# Patient Record
Sex: Male | Born: 1935 | Hispanic: No | Marital: Married
Health system: Southern US, Community
[De-identification: ages and names within clinical notes are randomized; demographics above are authoritative.]

---

## 1998-05-11 ENCOUNTER — Encounter: Payer: Self-pay | Admitting: Emergency Medicine

## 1998-05-12 ENCOUNTER — Inpatient Hospital Stay (HOSPITAL_COMMUNITY): Admission: EM | Admit: 1998-05-12 | Discharge: 1998-05-16 | Payer: Self-pay | Admitting: Family Medicine

## 1998-06-07 ENCOUNTER — Encounter: Payer: Self-pay | Admitting: Emergency Medicine

## 1998-06-07 ENCOUNTER — Emergency Department (HOSPITAL_COMMUNITY): Admission: EM | Admit: 1998-06-07 | Discharge: 1998-06-07 | Payer: Self-pay | Admitting: Emergency Medicine

## 1998-06-17 ENCOUNTER — Encounter: Payer: Self-pay | Admitting: Urology

## 1998-06-18 ENCOUNTER — Inpatient Hospital Stay (HOSPITAL_COMMUNITY): Admission: RE | Admit: 1998-06-18 | Discharge: 1998-06-22 | Payer: Self-pay | Admitting: Urology

## 1998-06-18 ENCOUNTER — Encounter: Payer: Self-pay | Admitting: Urology

## 1998-08-18 ENCOUNTER — Emergency Department (HOSPITAL_COMMUNITY): Admission: EM | Admit: 1998-08-18 | Discharge: 1998-08-18 | Payer: Self-pay | Admitting: Emergency Medicine

## 2001-04-23 ENCOUNTER — Emergency Department (HOSPITAL_COMMUNITY): Admission: EM | Admit: 2001-04-23 | Discharge: 2001-04-23 | Payer: Self-pay | Admitting: Emergency Medicine

## 2001-05-30 ENCOUNTER — Encounter: Payer: Self-pay | Admitting: Emergency Medicine

## 2001-05-30 ENCOUNTER — Inpatient Hospital Stay (HOSPITAL_COMMUNITY): Admission: EM | Admit: 2001-05-30 | Discharge: 2001-06-03 | Payer: Self-pay | Admitting: Emergency Medicine

## 2001-06-02 ENCOUNTER — Encounter: Payer: Self-pay | Admitting: Cardiology

## 2001-06-14 ENCOUNTER — Ambulatory Visit: Admission: RE | Admit: 2001-06-14 | Discharge: 2001-06-14 | Payer: Self-pay | Admitting: Endocrinology

## 2001-06-14 ENCOUNTER — Encounter: Payer: Self-pay | Admitting: Endocrinology

## 2001-08-06 ENCOUNTER — Emergency Department (HOSPITAL_COMMUNITY): Admission: EM | Admit: 2001-08-06 | Discharge: 2001-08-06 | Payer: Self-pay | Admitting: Emergency Medicine

## 2001-08-06 ENCOUNTER — Encounter: Payer: Self-pay | Admitting: Cardiology

## 2001-08-06 ENCOUNTER — Encounter: Payer: Self-pay | Admitting: Emergency Medicine

## 2001-08-08 ENCOUNTER — Encounter: Payer: Self-pay | Admitting: Emergency Medicine

## 2001-08-08 ENCOUNTER — Observation Stay (HOSPITAL_COMMUNITY): Admission: EM | Admit: 2001-08-08 | Discharge: 2001-08-09 | Payer: Self-pay | Admitting: Emergency Medicine

## 2001-09-06 ENCOUNTER — Emergency Department (HOSPITAL_COMMUNITY): Admission: EM | Admit: 2001-09-06 | Discharge: 2001-09-06 | Payer: Self-pay | Admitting: Emergency Medicine

## 2004-06-12 ENCOUNTER — Ambulatory Visit: Payer: Self-pay | Admitting: Cardiology

## 2004-06-26 ENCOUNTER — Ambulatory Visit: Payer: Self-pay | Admitting: Cardiology

## 2004-07-24 ENCOUNTER — Ambulatory Visit: Payer: Self-pay | Admitting: Cardiology

## 2004-08-11 ENCOUNTER — Ambulatory Visit: Payer: Self-pay | Admitting: Cardiology

## 2005-07-06 ENCOUNTER — Ambulatory Visit: Payer: Self-pay | Admitting: Cardiology

## 2005-07-20 ENCOUNTER — Ambulatory Visit: Payer: Self-pay | Admitting: Cardiology

## 2005-08-17 ENCOUNTER — Ambulatory Visit: Payer: Self-pay | Admitting: Cardiology

## 2005-09-04 ENCOUNTER — Ambulatory Visit: Payer: Self-pay | Admitting: Cardiology

## 2005-09-25 ENCOUNTER — Ambulatory Visit: Payer: Self-pay | Admitting: Cardiology

## 2006-08-28 ENCOUNTER — Ambulatory Visit: Payer: Self-pay | Admitting: Cardiology

## 2006-08-28 ENCOUNTER — Inpatient Hospital Stay (HOSPITAL_COMMUNITY): Admission: EM | Admit: 2006-08-28 | Discharge: 2006-09-01 | Payer: Self-pay | Admitting: Emergency Medicine

## 2006-08-30 ENCOUNTER — Encounter: Payer: Self-pay | Admitting: Internal Medicine

## 2006-08-30 ENCOUNTER — Ambulatory Visit: Payer: Self-pay | Admitting: Internal Medicine

## 2006-09-03 ENCOUNTER — Ambulatory Visit: Payer: Self-pay | Admitting: Cardiology

## 2006-09-06 ENCOUNTER — Ambulatory Visit: Payer: Self-pay | Admitting: Cardiology

## 2006-09-14 ENCOUNTER — Ambulatory Visit: Payer: Self-pay | Admitting: Cardiology

## 2006-09-21 ENCOUNTER — Ambulatory Visit: Payer: Self-pay | Admitting: Cardiovascular Disease

## 2006-09-21 ENCOUNTER — Ambulatory Visit: Payer: Self-pay | Admitting: Cardiology

## 2006-09-21 ENCOUNTER — Emergency Department (HOSPITAL_COMMUNITY): Admission: EM | Admit: 2006-09-21 | Discharge: 2006-09-21 | Payer: Self-pay | Admitting: Emergency Medicine

## 2006-09-21 LAB — CONVERTED CEMR LAB
Creatinine, Ser: 0.9 mg/dL (ref 0.4–1.5)
Glucose, Bld: 118 mg/dL — ABNORMAL HIGH (ref 70–99)
Potassium: 3.6 meq/L (ref 3.5–5.1)
Pro B Natriuretic peptide (BNP): 257 pg/mL — ABNORMAL HIGH (ref 0.0–100.0)
Sodium: 141 meq/L (ref 135–145)

## 2006-09-27 ENCOUNTER — Ambulatory Visit: Payer: Self-pay | Admitting: Cardiology

## 2006-09-27 LAB — CONVERTED CEMR LAB
CO2: 29 meq/L (ref 19–32)
Chloride: 103 meq/L (ref 96–112)
Creatinine, Ser: 1 mg/dL (ref 0.4–1.5)
Glucose, Bld: 104 mg/dL — ABNORMAL HIGH (ref 70–99)
Potassium: 3.8 meq/L (ref 3.5–5.1)
Pro B Natriuretic peptide (BNP): 96 pg/mL (ref 0.0–100.0)
Sodium: 139 meq/L (ref 135–145)

## 2006-10-07 ENCOUNTER — Ambulatory Visit: Payer: Self-pay | Admitting: Internal Medicine

## 2006-10-07 ENCOUNTER — Ambulatory Visit: Payer: Self-pay | Admitting: Cardiology

## 2006-10-28 ENCOUNTER — Ambulatory Visit: Payer: Self-pay | Admitting: Internal Medicine

## 2006-10-28 ENCOUNTER — Ambulatory Visit: Payer: Self-pay | Admitting: Cardiology

## 2006-10-29 ENCOUNTER — Ambulatory Visit: Payer: Self-pay | Admitting: Cardiology

## 2006-10-29 LAB — CONVERTED CEMR LAB
CO2: 28 meq/L (ref 19–32)
Chloride: 101 meq/L (ref 96–112)
Creatinine, Ser: 0.9 mg/dL (ref 0.4–1.5)
Glucose, Bld: 139 mg/dL — ABNORMAL HIGH (ref 70–99)
Potassium: 3.5 meq/L (ref 3.5–5.1)
Sodium: 138 meq/L (ref 135–145)

## 2006-11-16 ENCOUNTER — Emergency Department (HOSPITAL_COMMUNITY): Admission: EM | Admit: 2006-11-16 | Discharge: 2006-11-16 | Payer: Self-pay | Admitting: Family Medicine

## 2006-11-25 ENCOUNTER — Ambulatory Visit (HOSPITAL_COMMUNITY): Admission: RE | Admit: 2006-11-25 | Discharge: 2006-11-25 | Payer: Self-pay | Admitting: Nurse Practitioner

## 2006-11-25 ENCOUNTER — Ambulatory Visit: Payer: Self-pay | Admitting: Cardiology

## 2006-12-16 ENCOUNTER — Ambulatory Visit: Payer: Self-pay | Admitting: Cardiology

## 2007-01-18 ENCOUNTER — Ambulatory Visit: Payer: Self-pay | Admitting: Cardiovascular Disease

## 2007-01-18 ENCOUNTER — Ambulatory Visit: Payer: Self-pay | Admitting: Cardiology

## 2007-12-09 ENCOUNTER — Ambulatory Visit: Payer: Self-pay | Admitting: Cardiovascular Disease

## 2007-12-16 ENCOUNTER — Ambulatory Visit: Payer: Self-pay | Admitting: Cardiology

## 2007-12-21 ENCOUNTER — Ambulatory Visit: Payer: Self-pay | Admitting: Cardiology

## 2008-06-25 IMAGING — CR DG CHEST 2V
2 series · 2 of 2 positions shown · non-contrast
Comparison: None.

CLINICAL DATA: 78-year-old male with pain, shortness of breath, and right hand swelling. 
 CHEST - 2 VIEW:

[w chest pa]
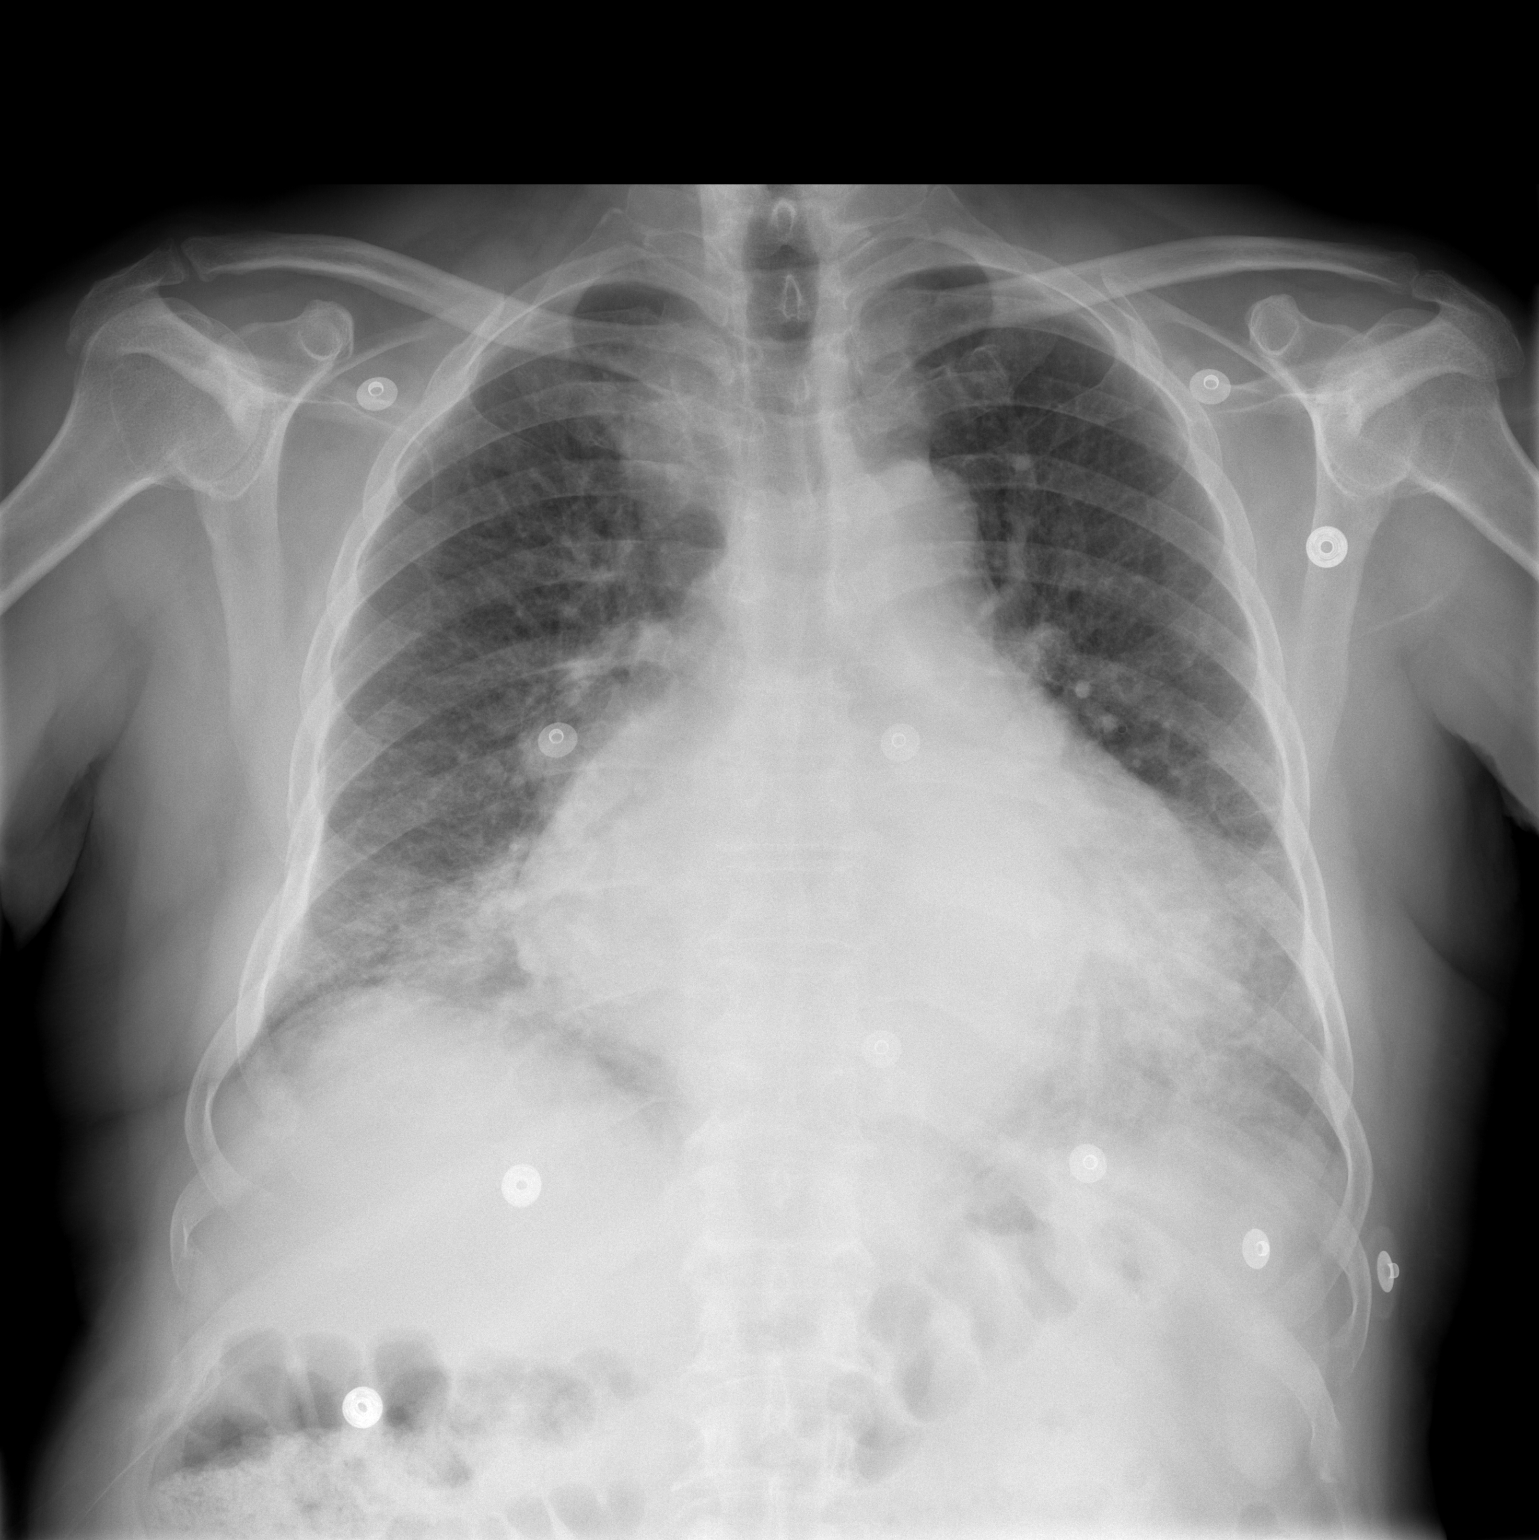

[w chest lat]
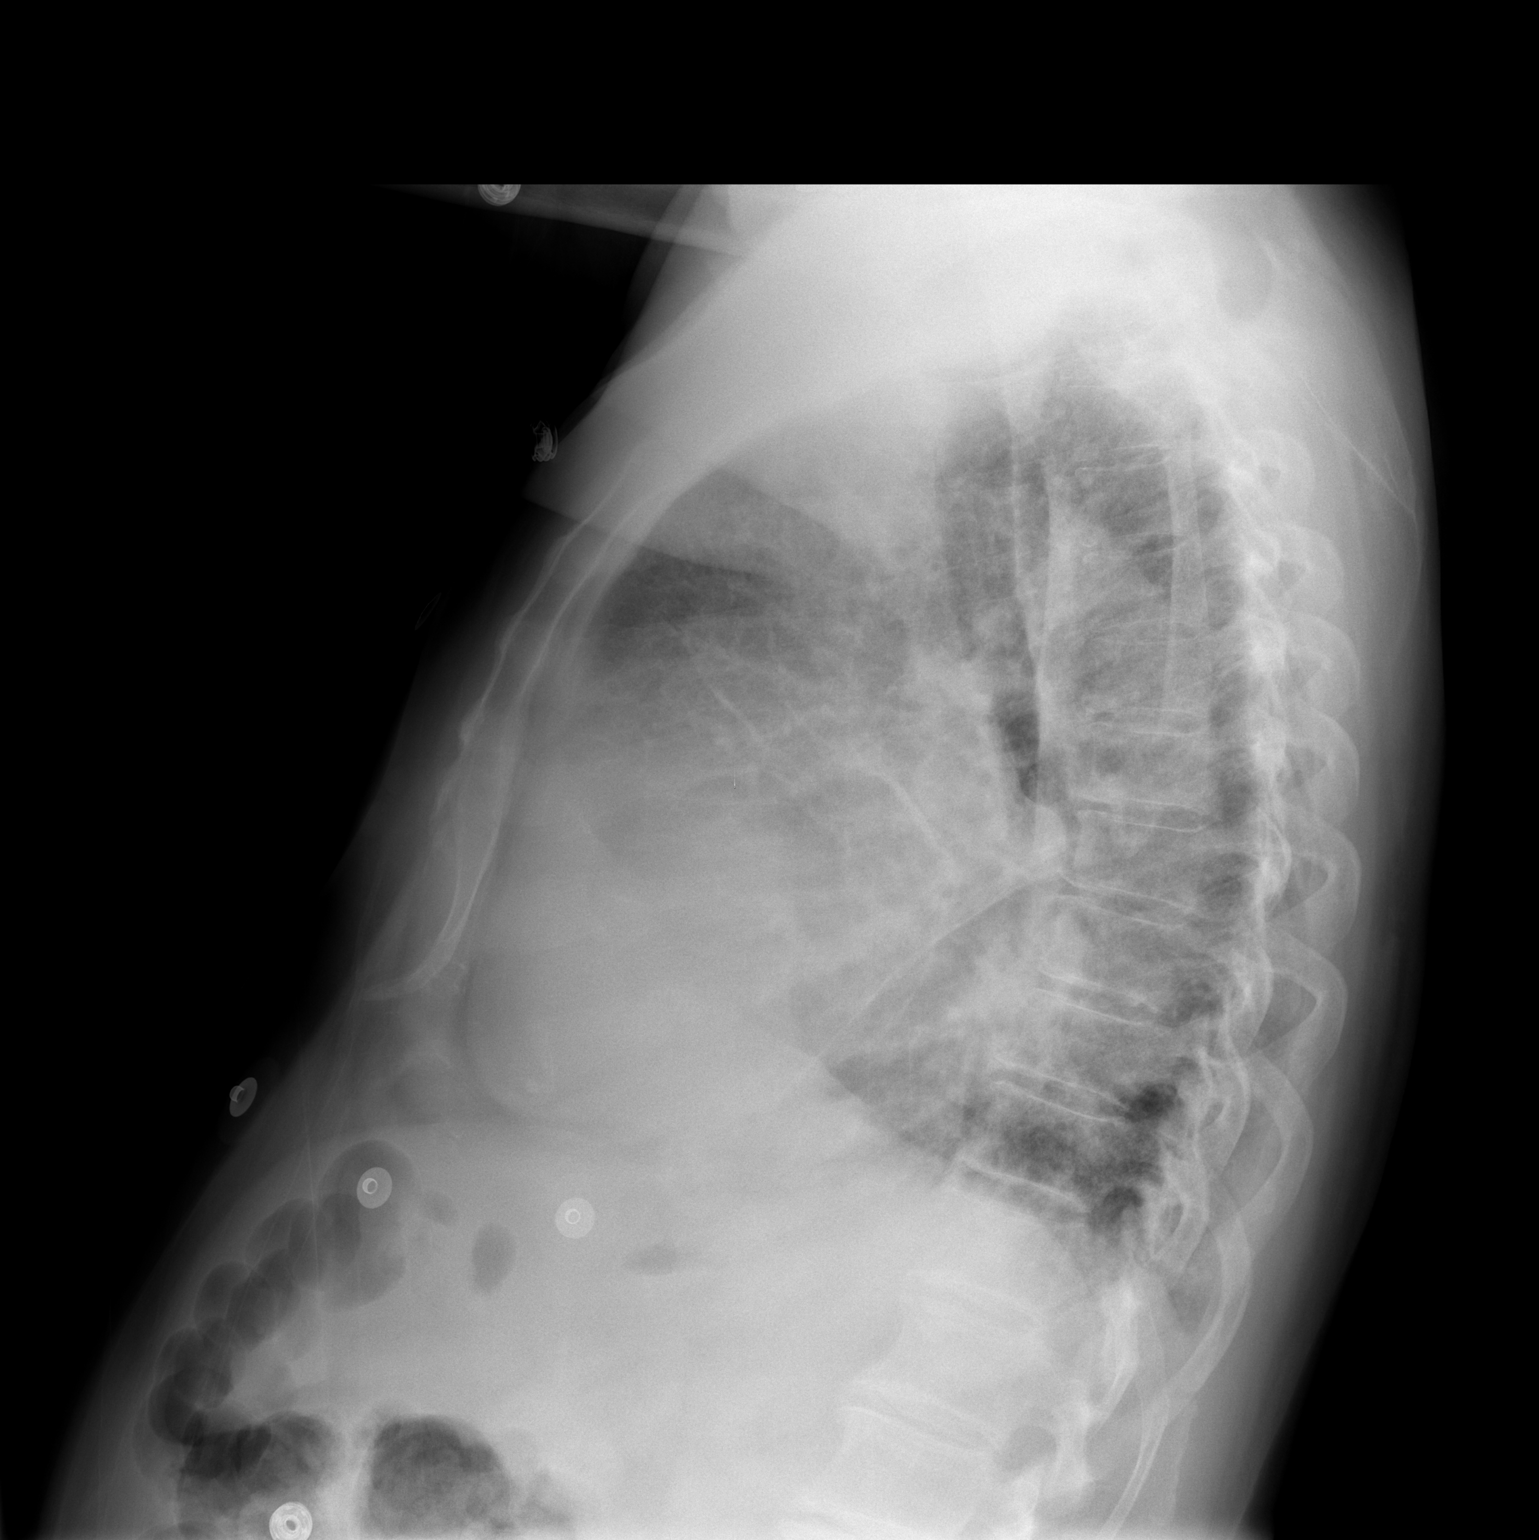

[2 of 2 positions shown; findings below may reference images not displayed]

FINDINGS: There is marked global cardiomegaly. There is increased pulmonary interstitial opacity and obscuration of vessel margins compatible with pulmonary edema. Increased bibasilar opacity likely reflects superimposed atelectasis. Small right pleural effusion is suspected on the lateral view. No pneumothorax. No acute osseous abnormality identified.
IMPRESSION: Marked cardiomegaly and acute pulmonary edema compatible with congestive heart failure. Superimposed basilar atelectasis.

## 2008-06-25 IMAGING — CR DG WRIST COMPLETE 3+V*R*
4 series · 4 of 4 positions shown · non-contrast
Comparison: none

CLINICAL DATA: Right wrist pain, swelling

RIGHT WRIST - 4 VIEW

[x wrist pa right]
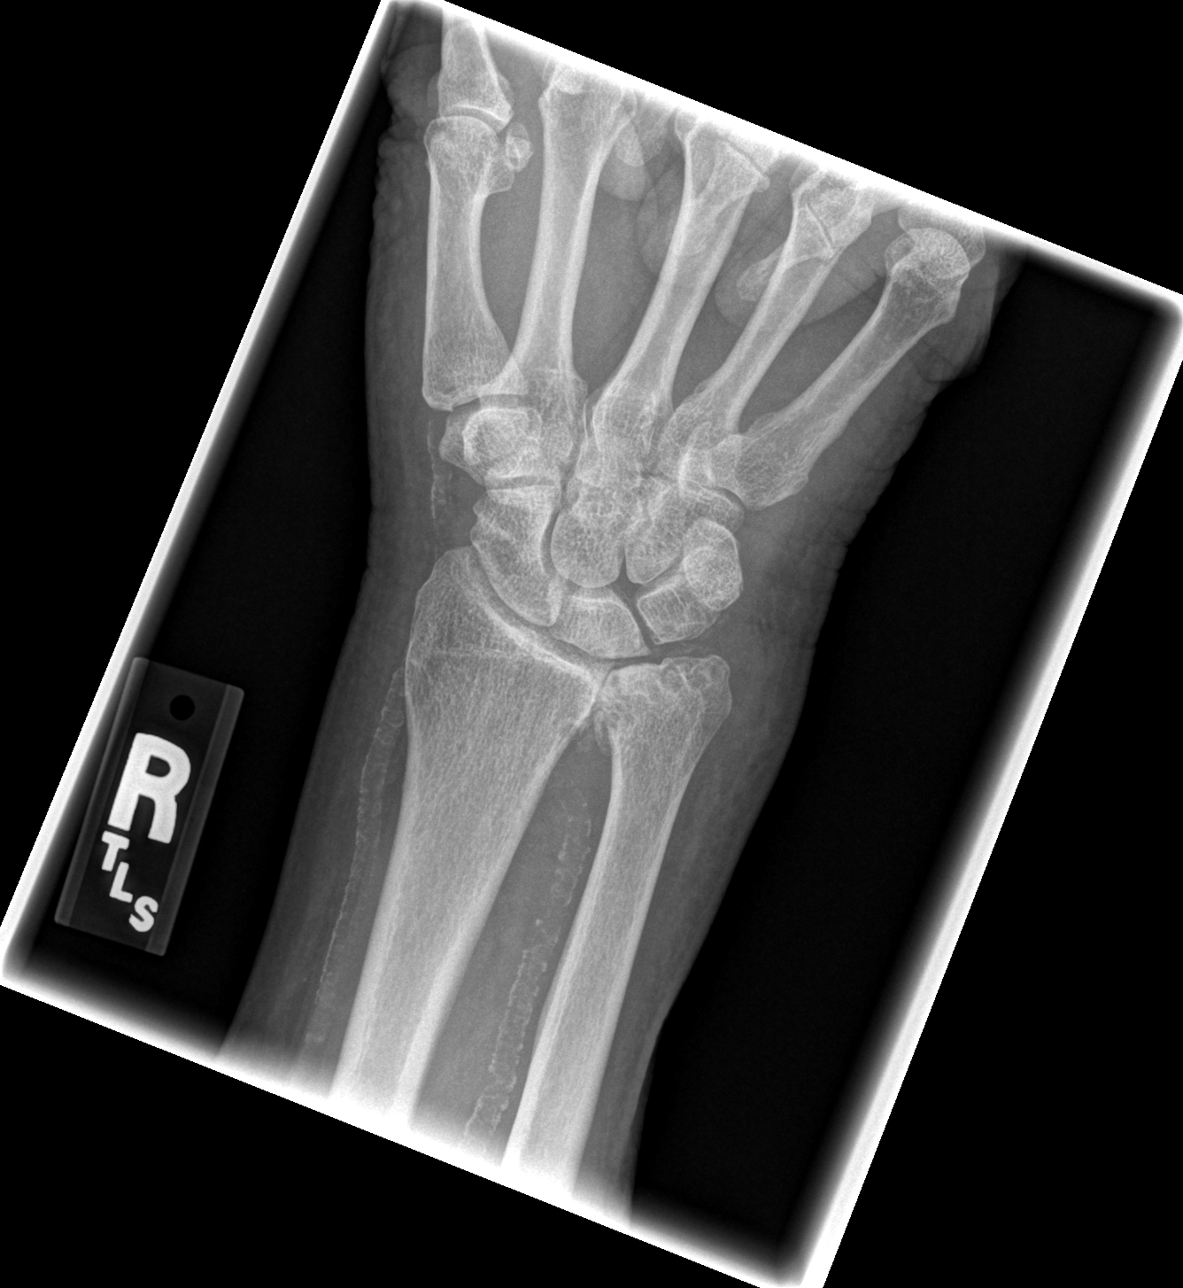

[x wrist obl right]
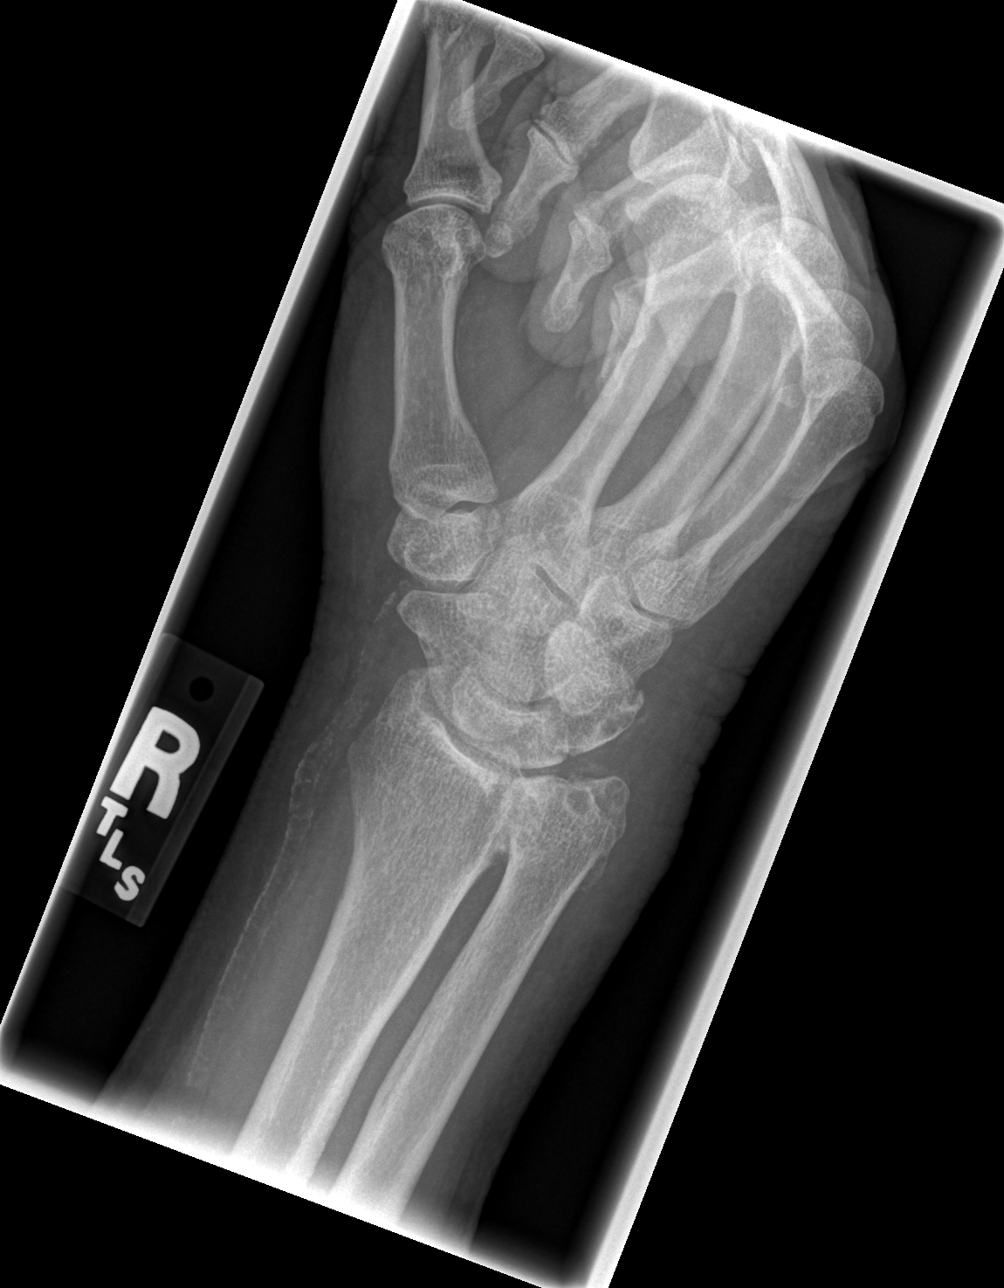

[x wrist lat right]
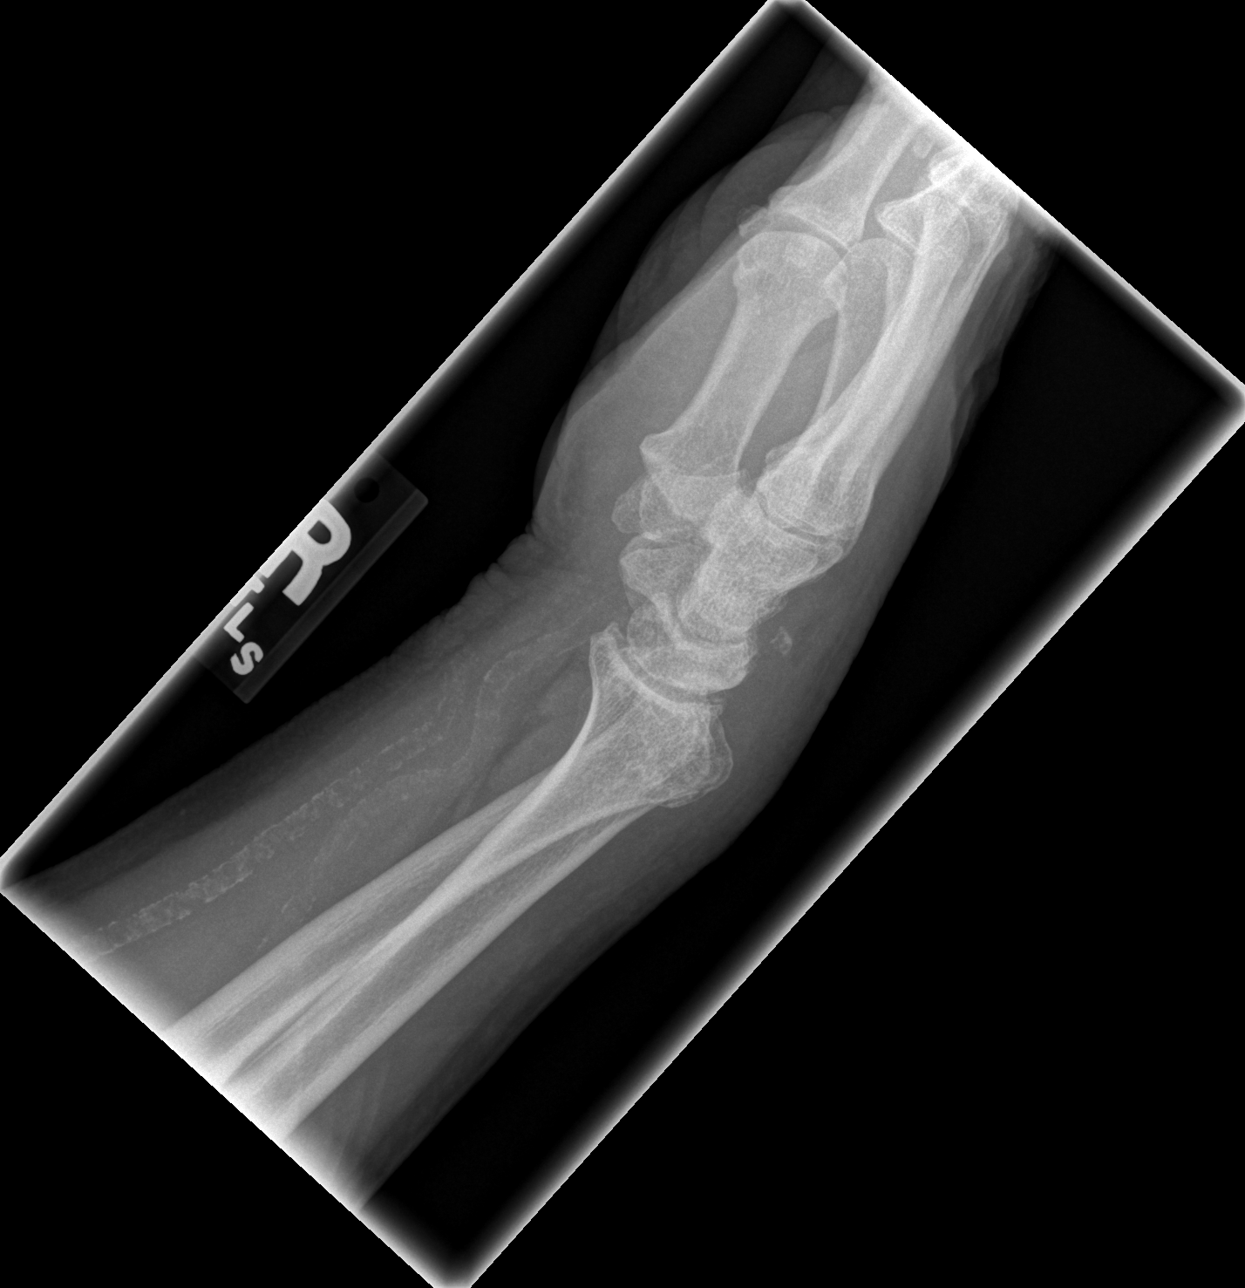

[x navicular]
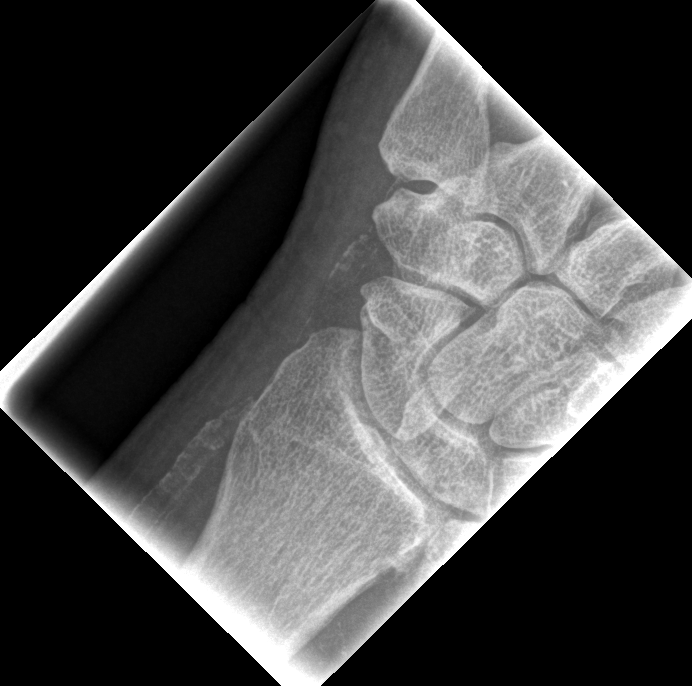

[4 of 4 positions shown; findings below may reference images not displayed]

FINDINGS: Advanced degenerative changes are noted in the wrist. Cystic changes
seen in the distal ulna and triquetrum. Joint space narrowing noted. There is
soft tissue swelling over the dorsum of the wrist. No acute bony abnormality.
Specifically, no fracture, subluxation, or dislocation.

IMPRESSION

Severe degenerative changes in the wrist. No acute bony abnormality.

## 2008-06-29 IMAGING — CR DG CHEST 2V
2 series · 2 of 2 positions shown · non-contrast
Comparison: 08/28/06.

CLINICAL DATA: Shortness of breath, CHF. 
 CHEST - 2 VIEW:

[w chest pa]
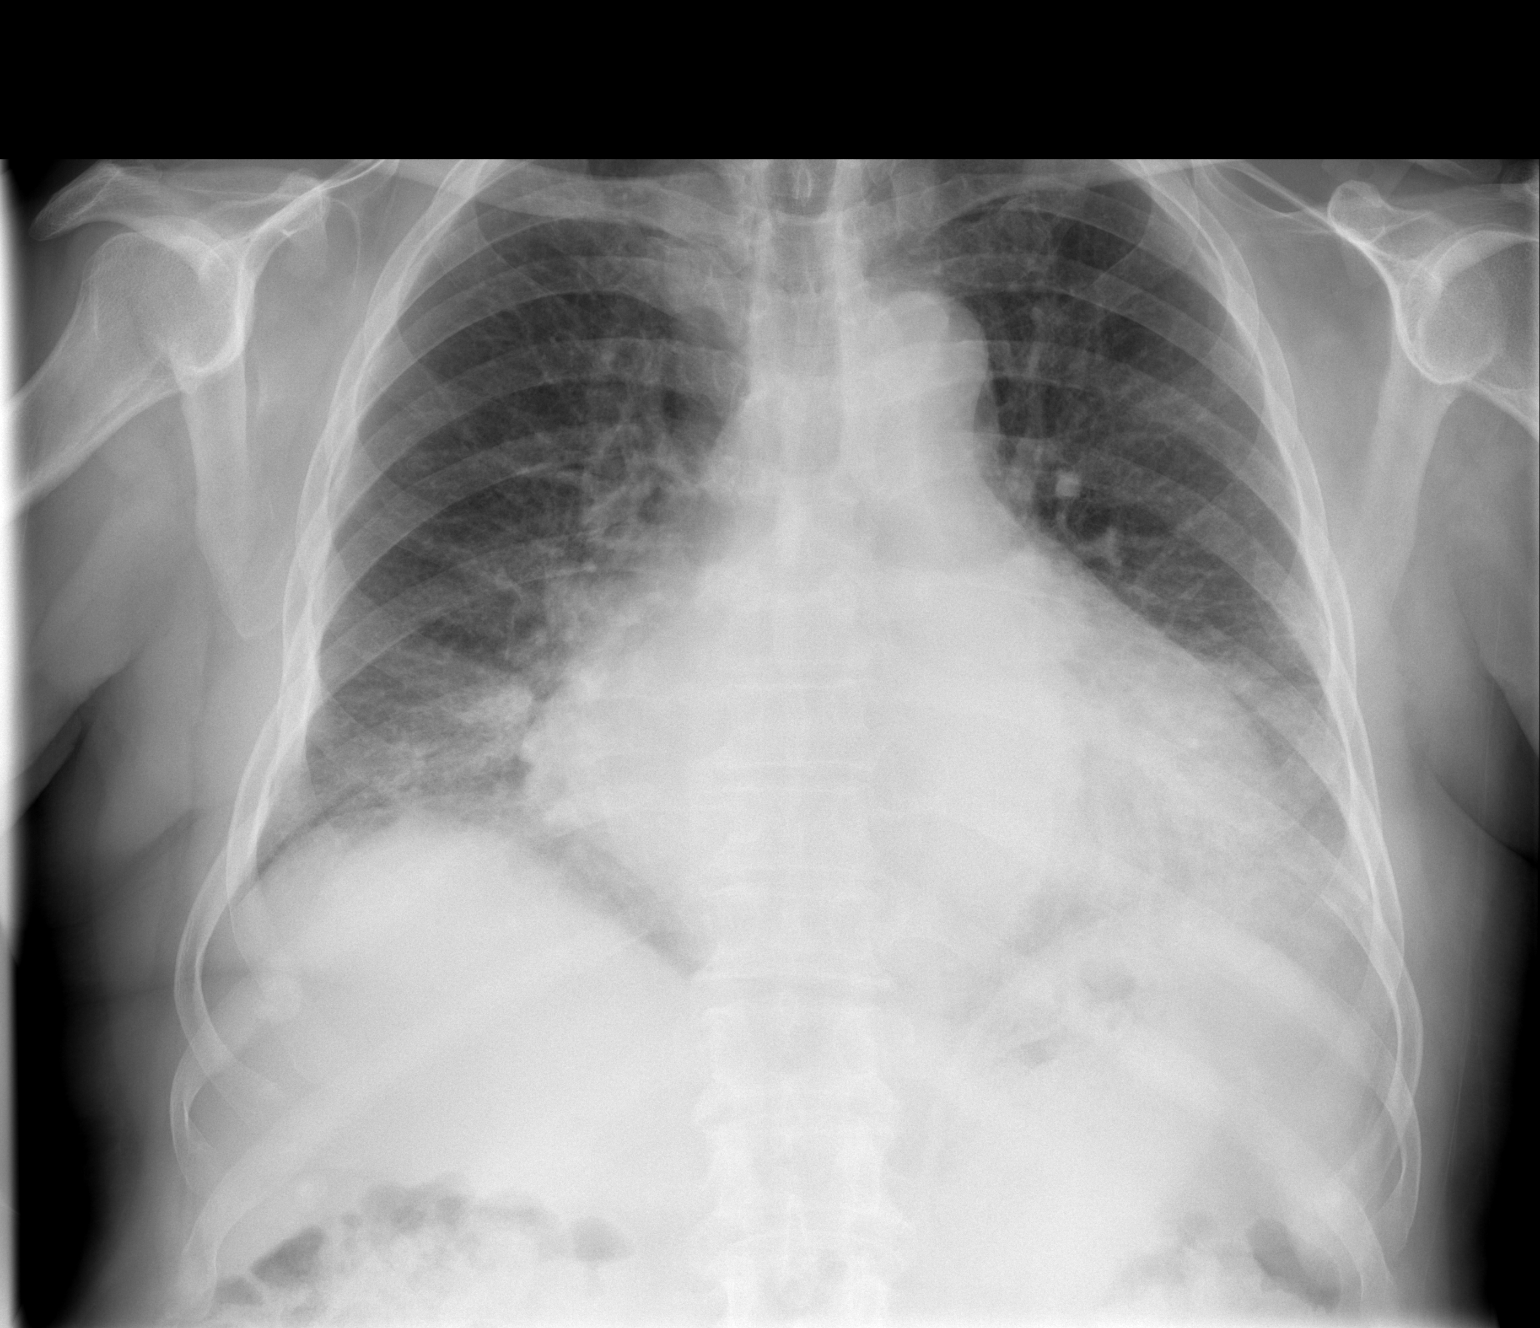

[w chest lat]
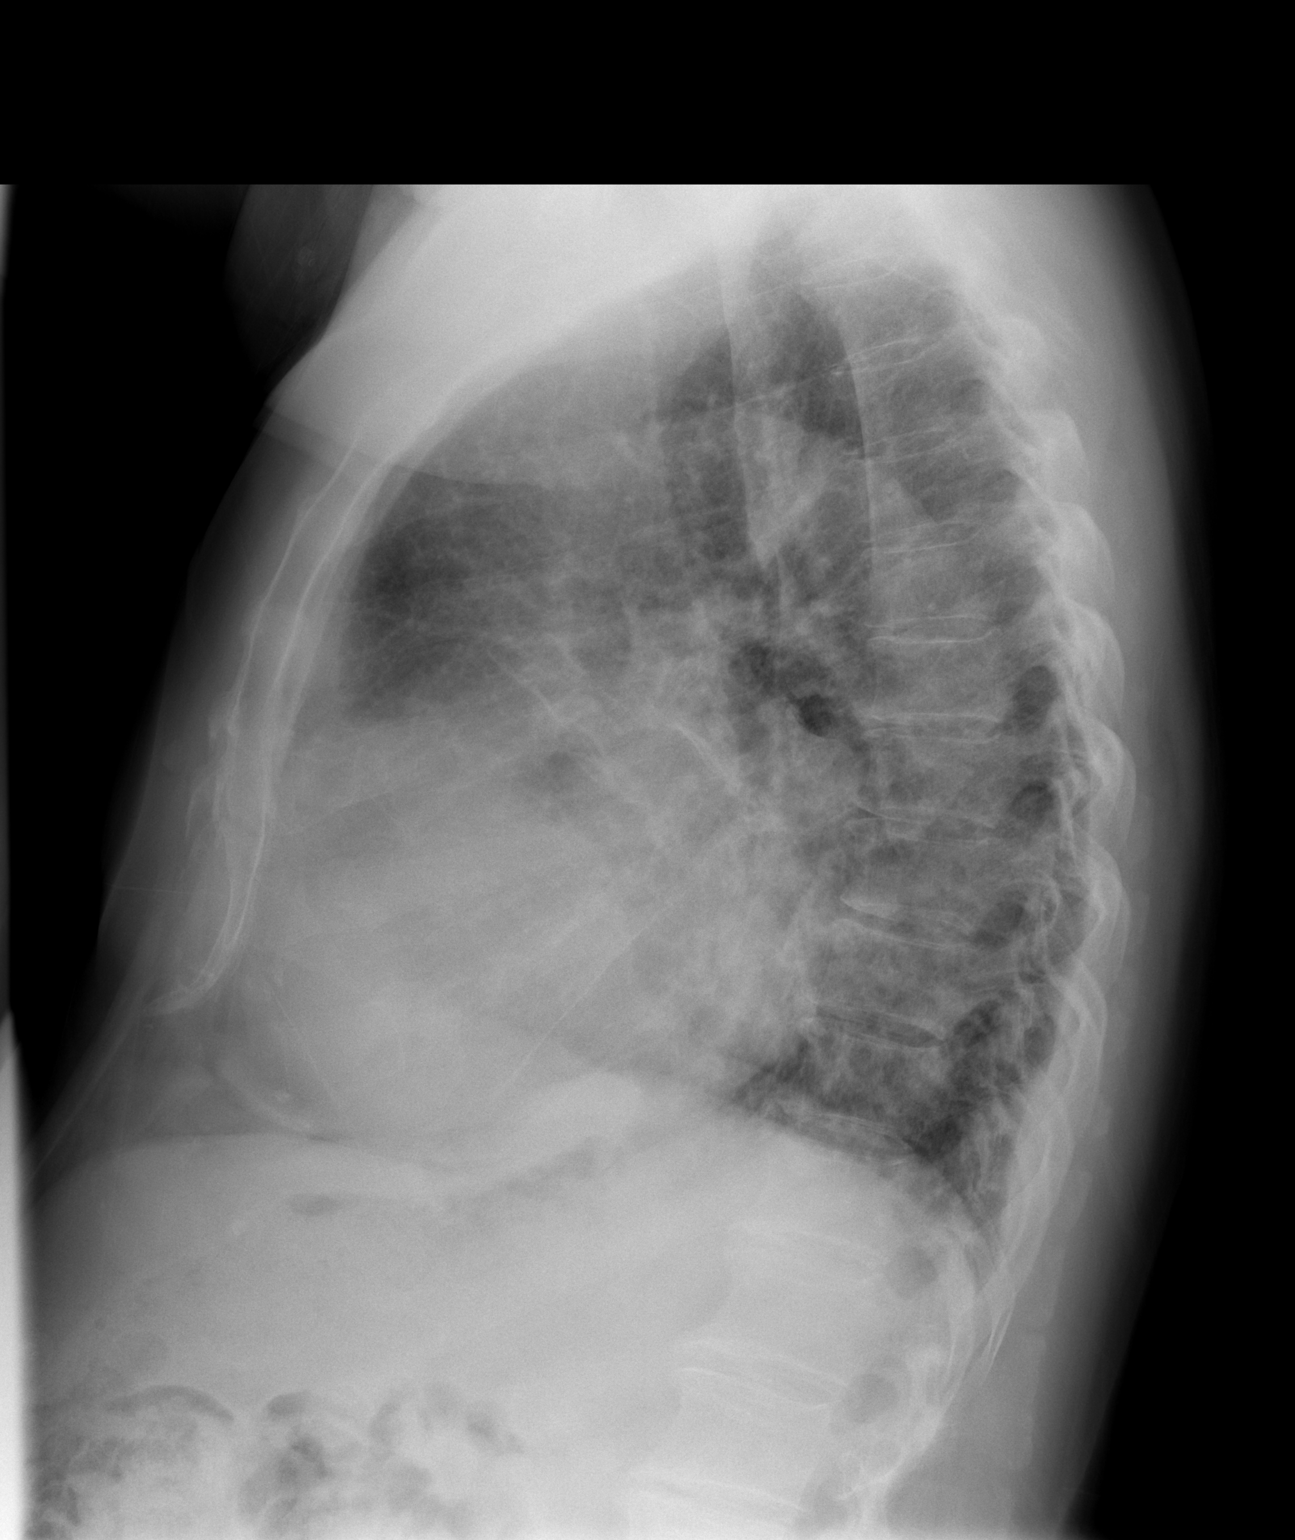

[2 of 2 positions shown; findings below may reference images not displayed]

FINDINGS: The heart is enlarged.  There is vascular congestion with mild edema which is unchanged.  Bibasilar atelectasis again noted without much change.
IMPRESSION: No significant change in congestive heart failure with mild edema.

## 2008-07-19 DIAGNOSIS — Z87898 Personal history of other specified conditions: Secondary | ICD-10-CM

## 2008-07-19 DIAGNOSIS — I4891 Unspecified atrial fibrillation: Secondary | ICD-10-CM

## 2008-07-19 DIAGNOSIS — I509 Heart failure, unspecified: Secondary | ICD-10-CM | POA: Insufficient documentation

## 2008-07-19 DIAGNOSIS — M109 Gout, unspecified: Secondary | ICD-10-CM

## 2008-07-19 DIAGNOSIS — M129 Arthropathy, unspecified: Secondary | ICD-10-CM | POA: Insufficient documentation

## 2008-07-19 DIAGNOSIS — K219 Gastro-esophageal reflux disease without esophagitis: Secondary | ICD-10-CM

## 2008-08-07 ENCOUNTER — Encounter: Payer: Self-pay | Admitting: *Deleted

## 2008-09-12 ENCOUNTER — Encounter: Payer: Self-pay | Admitting: *Deleted

## 2008-10-18 ENCOUNTER — Encounter: Payer: Self-pay | Admitting: Cardiology

## 2008-10-24 ENCOUNTER — Encounter (INDEPENDENT_AMBULATORY_CARE_PROVIDER_SITE_OTHER): Payer: Self-pay | Admitting: *Deleted

## 2010-02-18 ENCOUNTER — Telehealth: Payer: Self-pay | Admitting: Cardiology

## 2010-03-28 ENCOUNTER — Telehealth: Payer: Self-pay | Admitting: Cardiology

## 2010-03-31 ENCOUNTER — Encounter: Payer: Self-pay | Admitting: Cardiology

## 2010-04-04 ENCOUNTER — Ambulatory Visit: Admission: RE | Admit: 2010-04-04 | Discharge: 2010-04-04 | Payer: Self-pay | Source: Home / Self Care

## 2010-04-10 NOTE — Progress Notes (Signed)
Summary: refill request  Medications Added WARFARIN SODIUM 2.5 MG TABS (WARFARIN SODIUM) Use as directed by Anticoagualtion Clinic       Phone Note Refill Request Message from:  Patient on March 28, 2010 1:35 PM  coumadin refill/will run out "sunday   Method Requested: Telephone to Pharmacy Initial call taken by: Melissa Tatum,  March 28, 2010 1:35 PM  Follow-up for Phone Call        Spoke with daughter-in-law and she states that pt has been in [place] for over a year but he has been taking his coumadin there and will run out on Sunday. They are requesting a refill. Coumadin is not listed on his MAR. He doesn't see Dr Anala Whisenant til Feb 2012. Please advise.  Follow-up by: Tiffany Muse, RN, BSN,  March 28, 2010 3:50 PM  Additional Follow-up for Phone Call Additional follow up Details #1::        Dr Pailyn Bellevue's nurse states we can authorize Rx from MD note 2009.  Additional Follow-up by: Tiffany Muse, RN, BSN,  March 28, 2010 3:56 PM    New/Updated Medications: WARFARIN SODIUM 2.5 MG TABS (WARFARIN SODIUM) Use as directed by Anticoagualtion Clinic Prescriptions: WARFARIN SODIUM 2.5 MG TABS (WARFARIN SODIUM) Use as directed by Anticoagualtion Clinic  #40 x 0   Entered by:   Tiffany Muse, RN, BSN   Authorized by:   Iver Miklas, MD, FACC   Signed by:   Tiffany Muse, RN, BSN on 03/28/2010   Method used:   Electronically to        [facility] [address] #[id number]* (retail)       [address]       [place], [place]  [address]       Ph: [phone]       Fax: [phone]   RxID:   [id number]

## 2010-04-10 NOTE — Medication Information (Signed)
Summary: rov/tm  Anticoagulant Therapy  Managed by: Bethena Midget, RN, BSN Referring MD: Illene Regulus Supervising MD: Tenny Craw MD, Gunnar Fusi Indication 1: Mitral Stenosis (ICD-394.0) Indication 2: Congestive Heart Failure (ICD-428.0) [place] Site: [facility] INR POC 1.5 INR RANGE 2 - 3  Dietary changes: no    Health status changes: no    Bleeding/hemorrhagic complications: no    Recent/future hospitalizations: no    Any changes in medication regimen? no    Recent/future dental: no  Any missed doses?: no       Is patient compliant with meds? yes      Comments: Pt returns from [place]. States he hasn't been off coumadin, no miss doses while he was there.   Anticoagulation Management History:      The patient is taking warfarin and comes in today for a routine follow up visit.  Positive risk factors for bleeding include an age of 75 years or older.  The bleeding index is 'intermediate risk'.  Positive CHADS2 values include History of CHF and Age > 75 years old.  The start date was 05/08/1998.  Anticoagulation responsible provider: Tenny Craw MD, Gunnar Fusi.  INR POC: 1.5.  Cuvette Lot#: [id number].  Exp: 03/2011.    Anticoagulation Management Assessment/Plan:      The patient's current anticoagulation dose is Warfarin sodium 2.5 mg tabs: Use as directed by Anticoagualtion Clinic.  The target INR is 2 - 3.  The next INR is due 04/11/2010.  Anticoagulation instructions were given to patient/Daugter in law.  Results were reviewed/authorized by Bethena Midget, RN, BSN.  He was notified by Bethena Midget, RN, BSN.         Prior Anticoagulation Instructions: 2.5mg  QD / 1.25MG  WED  Current Anticoagulation Instructions: INR 1.5 Today take 1.5 pills then resume 1 pill everyday. Recheck in one week.

## 2010-04-10 NOTE — Progress Notes (Signed)
Summary: questions about medications   Phone Note Call from Patient Call back at Home Phone [phone]   Caller: Daughter/Vicki Summary of Call: Pt daughter calling about medications Initial call taken by: Judie Grieve,  February 18, 2010 1:44 PM  Follow-up for Phone Call        refills sent in on 02/10/2010 the pharmacy is saying they didn't recieve.  Will send in again.  Daughter instructed that pt needs an appointment every year to continue to have meds refills.  Pt is in [place] now and she is unsure when he will return Follow-up by: Charolotte Capuchin, RN,  February 18, 2010 2:15 PM    Prescriptions: OMEPRAZOLE 20 MG CPDR (OMEPRAZOLE) 1 by mouth daily  #30 x 3   Entered by:   Charolotte Capuchin, RN   Authorized by:   Rollene Rotunda, MD, [facility]   Signed by:   Charolotte Capuchin, RN on 02/18/2010   Method used:   Electronically to        [facility] [address] #[id number]* (retail)       [address] [place], [place]  [address]       Ph: [phone]       Fax: [phone]   RxID:   [id number] DIGOXIN 0.125 MG TABS (DIGOXIN) 1 by mouth daily  #30 x 3   Entered by:   Charolotte Capuchin, RN   Authorized by:   Rollene Rotunda, MD, [facility]   Signed by:   Charolotte Capuchin, RN on 02/18/2010   Method used:   Electronically to        [facility] [address] #[id number]* (retail)       [address] [place], [place]  [address]       Ph: [phone]       Fax: [phone]   RxID:   [id number]

## 2010-04-11 ENCOUNTER — Encounter: Payer: Self-pay | Admitting: Cardiology

## 2010-04-11 ENCOUNTER — Encounter (INDEPENDENT_AMBULATORY_CARE_PROVIDER_SITE_OTHER): Payer: Medicare Other

## 2010-04-11 DIAGNOSIS — Z7901 Long term (current) use of anticoagulants: Secondary | ICD-10-CM

## 2010-04-11 DIAGNOSIS — I509 Heart failure, unspecified: Secondary | ICD-10-CM

## 2010-04-11 LAB — CONVERTED CEMR LAB: POC INR: 2.3

## 2010-04-16 NOTE — Letter (Signed)
Summary: [employer] Rx [employer]  [employer] Plan   Imported By: Marylou Mccoy 04/11/2010 12:27:25  _____________________________________________________________________  External Attachment:    Type:   Image     Comment:   External Document

## 2010-04-16 NOTE — Medication Information (Signed)
Summary: rov/tm   Anticoagulant Therapy  Managed by: Windell Hummingbird, RN Referring MD: Illene Regulus Supervising MD: Jens Som MD, Arlys John Indication 1: Mitral Stenosis (ICD-394.0) Indication 2: Congestive Heart Failure (ICD-428.0) [place] Site: [facility] INR POC 2.3 INR RANGE 2 - 3  Dietary changes: no    Health status changes: no    Bleeding/hemorrhagic complications: no    Recent/future hospitalizations: no    Any changes in medication regimen? no    Recent/future dental: no  Any missed doses?: no       Is patient compliant with meds? yes       Anticoagulation Management History:      The patient is taking warfarin and comes in today for a routine follow up visit.  Positive risk factors for bleeding include an age of 75 years or older.  The bleeding index is 'intermediate risk'.  Positive CHADS2 values include History of CHF and Age > 75 years old.  The start date was 05/08/1998.  Anticoagulation responsible provider: Jens Som MD, Arlys John.  INR POC: 2.3.  Cuvette Lot#: [id number].  Exp: 03/2011.    Anticoagulation Management Assessment/Plan:      The patient's current anticoagulation dose is Warfarin sodium 2.5 mg tabs: Use as directed by Anticoagualtion Clinic.  The target INR is 2 - 3.  The next INR is due 04/25/2010.  Anticoagulation instructions were given to patient/Daugter in law.  Results were reviewed/authorized by Windell Hummingbird, RN.  He was notified by Windell Hummingbird, RN.         Prior Anticoagulation Instructions: INR 1.5 Today take 1.5 pills then resume 1 pill everyday. Recheck in one week.   Current Anticoagulation Instructions: INR 2.3 Continue taking 1 tablet every day. Recheck in 2 weeks.

## 2010-04-25 ENCOUNTER — Encounter (INDEPENDENT_AMBULATORY_CARE_PROVIDER_SITE_OTHER): Payer: Medicare Other

## 2010-04-25 ENCOUNTER — Encounter: Payer: Self-pay | Admitting: Cardiology

## 2010-04-25 DIAGNOSIS — I509 Heart failure, unspecified: Secondary | ICD-10-CM

## 2010-04-25 DIAGNOSIS — Z7901 Long term (current) use of anticoagulants: Secondary | ICD-10-CM

## 2010-04-25 LAB — CONVERTED CEMR LAB: POC INR: 3.1

## 2010-04-30 NOTE — Medication Information (Signed)
Summary: rov/pc  Anticoagulant Therapy  Managed by: Weston Brass, PharmD Referring MD: Illene Regulus Supervising MD: Daleen Squibb MD, Maisie Fus Indication 1: Mitral Stenosis (ICD-394.0) Indication 2: Congestive Heart Failure (ICD-428.0) [place] Site: [facility] INR POC 3.1 INR RANGE 2 - 3  Dietary changes: no    Health status changes: no    Bleeding/hemorrhagic complications: no    Recent/future hospitalizations: no    Any changes in medication regimen? no    Recent/future dental: no  Any missed doses?: no       Is patient compliant with meds? yes       Anticoagulation Management History:      The patient is taking warfarin and comes in today for a routine follow up visit.  Positive risk factors for bleeding include an age of 75 years or older.  The bleeding index is 'intermediate risk'.  Positive CHADS2 values include History of CHF and Age > 22 years old.  The start date was 05/08/1998.  Anticoagulation responsible provider: Daleen Squibb MD, Maisie Fus.  INR POC: 3.1.  Cuvette Lot#: [id number].  Exp: 03/2011.    Anticoagulation Management Assessment/Plan:      The patient's current anticoagulation dose is Warfarin sodium 2.5 mg tabs: Use as directed by Anticoagualtion Clinic.  The target INR is 2 - 3.  The next INR is due 05/09/2010.  Anticoagulation instructions were given to patient/Daugter in law.  Results were reviewed/authorized by Weston Brass, PharmD.  He was notified by Margot Chimes PharmD Candidate.         Prior Anticoagulation Instructions: INR 2.3 Continue taking 1 tablet every day. Recheck in 2 weeks.  Current Anticoagulation Instructions: INR 3.1  Only take 1/2 tablet today and then resume your normal dose of 1 tablet everyday.  Recheck INR in 2 weeks.

## 2010-05-01 ENCOUNTER — Encounter: Payer: Self-pay | Admitting: Cardiology

## 2010-05-01 ENCOUNTER — Encounter (INDEPENDENT_AMBULATORY_CARE_PROVIDER_SITE_OTHER): Payer: Medicare Other | Admitting: Cardiology

## 2010-05-01 DIAGNOSIS — I4891 Unspecified atrial fibrillation: Secondary | ICD-10-CM

## 2010-05-01 DIAGNOSIS — I050 Rheumatic mitral stenosis: Secondary | ICD-10-CM | POA: Insufficient documentation

## 2010-05-01 DIAGNOSIS — I509 Heart failure, unspecified: Secondary | ICD-10-CM

## 2010-05-06 NOTE — Miscellaneous (Signed)
Summary: echo  ekg orders  Clinical Lists Changes  Problems: Added new problem of MITRAL STENOSIS (ICD-394.0) Orders: Added new Referral order of Echocardiogram (Echo) - Signed Added new Service order of EKG w/ Interpretation (93000) - Signed Observations: Added new observation of PI CARDIO: Your physician recommends that you schedule a follow-up appointment in: 1 yr with Dr Antoine Poche Your physician recommends that you continue on your current medications as directed. Please refer to the Current Medication list given to you today. Your physician has requested that you have an echocardiogram.  Echocardiography is a painless test that uses sound waves to create images of your heart. It provides your doctor with information about the size and shape of your heart and how well your heart's chambers and valves are working.  This procedure takes approximately one hour. There are no restrictions for this procedure. (05/01/2010 16:44)      Patient Instructions: 1)  Your physician recommends that you schedule a follow-up appointment in: 1 yr with Dr Antoine Poche 2)  Your physician recommends that you continue on your current medications as directed. Please refer to the Current Medication list given to you today. 3)  Your physician has requested that you have an echocardiogram.  Echocardiography is a painless test that uses sound waves to create images of your heart. It provides your doctor with information about the size and shape of your heart and how well your heart's chambers and valves are working.  This procedure takes approximately one hour. There are no restrictions for this procedure.

## 2010-05-06 NOTE — Assessment & Plan Note (Signed)
Summary: f3y.gd/ per pt daughter call. gd/ pts dtr in law rs appt.mt/sp      Allergies Added: NKDA  Visit Type:  Follow-up  CC:  Atrial Fibrillation, CHF, and MS.  History of Present Illness: The patient presents for followup after having been gone for about 2 and half years. He goes back to work between living here for very short periods and predominantly in [place]. There he does get his Coumadin checked and refilled and we verified this through our interpreter today. He otherwise gets his medications prescribed by [place] and reports that he is compliant with them. He does get his Coumadin checked here when he lives locally. He says he is feeling well. He has no palpitations, presyncope or syncope. He has no chest pressure, neck or arm discomfort. He has no shortness of breath, PND or orthopnea. He has had no weight gain or edema.  Current Medications (verified): 1)  Klor-Con M20 20 Meq Cr-Tabs (Potassium Chloride Crys Cr) .Marland Kitchen.. 1 By Mouth Daily 2)  Digoxin 0.125 Mg Tabs (Digoxin) .Marland Kitchen.. 1 By Mouth Daily 3)  Omeprazole 20 Mg Cpdr (Omeprazole) .Marland Kitchen.. 1 By Mouth Daily 4)  Furosemide 80 Mg Tabs (Furosemide) .Marland Kitchen.. 1 By Mouth Two Times A Day 5)  Carvedilol 6.25 Mg Tabs (Carvedilol) .Marland Kitchen.. 1 By Mouth Two Times A Day 6)  Benicar 20 Mg Tabs (Olmesartan Medoxomil) .Marland Kitchen.. 1 By Mouth Daily 7)  Warfarin Sodium 2.5 Mg Tabs (Warfarin Sodium) .... Use As Directed By Anticoagualtion Clinic  Allergies (verified): No Known Drug Allergies  Past History:  Past Medical History: CONGESTIVE HEART FAILURE, UNSPECIFIED (ICD-428.0) EF 25%) ATRIAL FIBRILLATION (ICD-427.31) ARTHRITIS (ICD-716.90) GERD (ICD-530.81) BENIGN PROSTATIC HYPERTROPHY, HX OF (ICD-V13.8) GOUT (ICD-274.9) COUMADIN THERAPY (ICD-V58.61)    Past Surgical History: Mitral valvuloplasty at [facility]  Review of Systems       As stated in the HPI and negative for all other systems.   Vital Signs:  Patient profile:   77 year old  male Height:      62 inches Weight:      155 pounds BMI:     28.45 Pulse rate:   67 / minute Resp:     16 per minute BP sitting:   112 / 62  (right arm)  Vitals Entered By: Marrion Coy, CNA (May 01, 2010 4:01 PM)  Physical Exam  General:  Well developed, well nourished, in no acute distress. Head:  normocephalic and atraumatic Eyes:  PERRLA/EOM intact; conjunctiva and lids normal. Neck:  Neck supple, no JVD. No masses, thyromegaly or abnormal cervical nodes. Chest Wall:  no deformities or breast masses noted Lungs:  Clear bilaterally to auscultation and percussion. Abdomen:  Bowel sounds positive; abdomen soft and non-tender without masses, organomegaly, or hernias noted. No hepatosplenomegaly. Msk:  Back normal, normal gait. Muscle strength and tone normal. Extremities:  No clubbing or cyanosis. Neurologic:  Alert and oriented x 3. Skin:  Intact without lesions or rashes. Cervical Nodes:  no significant adenopathy Inguinal Nodes:  no significant adenopathy Psych:  Normal affect.   Detailed Cardiovascular Exam  Neck    Carotids: Carotids full and equal bilaterally without bruits.      Neck Veins: Normal, no JVD.    Heart    Inspection: no deformities or lifts noted.      Palpation: normal PMI with no thrills palpable.      Auscultation: irregular rhythm, S1 and S2 within normal limits, no S3, 2/6 axillary systolic murmur, brief, non-holosystolic, no diastolic murmurs  Vascular    Abdominal Aorta: no palpable masses, pulsations, or audible bruits.      Femoral Pulses: normal femoral pulses bilaterally.      Pedal Pulses: normal pedal pulses bilaterally.      Radial Pulses: normal radial pulses bilaterally.      Peripheral Circulation: no clubbing, cyanosis, or edema noted with normal capillary refill.     EKG  Procedure date:  05/01/2010  Findings:      atrial fibrillation, rate 68, axis within normal limits, intervals within normal limits, ventricular  hypertrophy with repolarization changes and lateral T-wave inversions unchanged from previous  Impression & Recommendations:  Problem # 1:  ATRIAL FIBRILLATION (ICD-427.31) We had a long discussion with him through the interpreter and he understands the need for compliance. I feel comfortable he does comply and gets his Coumadin prescribed and checked in [place] when he is not living here. He has good rate control and he will continue with meds as listed.  Problem # 2:  CONGESTIVE HEART FAILURE, UNSPECIFIED (ICD-428.0) He will get an echocardiogram to verify that his ejection fraction is unchanged will hopefully improve and also to look at the mitral valve. He will continue the meds as listed pending these results.  Problem # 3:  COUMADIN THERAPY (ICD-V58.61) As above.

## 2010-05-06 NOTE — Miscellaneous (Signed)
Summary: RX for coumadin  Clinical Lists Changes  Medications: Rx of WARFARIN SODIUM 2.5 MG TABS (WARFARIN SODIUM) Use as directed by Anticoagualtion Clinic;  #40 x 0;  Signed;  Entered by: Charolotte Capuchin, RN;  Authorized by: Rollene Rotunda, MD, [facility];  Method used: Electronically to [facility] #[id number]*, [address] [place], [place]  [address], Ph: [phone], Fax: [phone]    Prescriptions: WARFARIN SODIUM 2.5 MG TABS (WARFARIN SODIUM) Use as directed by Anticoagualtion Clinic  #40 x 0   Entered by:   Charolotte Capuchin, RN   Authorized by:   Rollene Rotunda, MD, [facility]   Signed by:   Charolotte Capuchin, RN on 05/01/2010   Method used:   Electronically to        [facility] [address] #[id number]* (retail)       [address] [place], [place]  [address]       Ph: [phone]       Fax: [phone]   RxID:   [id number]

## 2010-05-09 ENCOUNTER — Encounter: Payer: Self-pay | Admitting: Cardiology

## 2010-05-09 DIAGNOSIS — I050 Rheumatic mitral stenosis: Secondary | ICD-10-CM

## 2010-05-09 DIAGNOSIS — I4891 Unspecified atrial fibrillation: Secondary | ICD-10-CM

## 2010-05-13 ENCOUNTER — Other Ambulatory Visit (HOSPITAL_COMMUNITY): Payer: Medicare Other

## 2010-05-31 ENCOUNTER — Other Ambulatory Visit: Payer: Self-pay | Admitting: Cardiology

## 2010-06-05 ENCOUNTER — Other Ambulatory Visit: Payer: Self-pay | Admitting: *Deleted

## 2010-06-05 MED ORDER — POTASSIUM CHLORIDE CRYS ER 20 MEQ PO TBCR: 20.0000 meq | EXTENDED_RELEASE_TABLET | Freq: Every day | ORAL | Status: DC

## 2010-06-05 MED ORDER — OLMESARTAN MEDOXOMIL 20 MG PO TABS: 20.0000 mg | ORAL_TABLET | Freq: Every day | ORAL | Status: DC

## 2010-06-05 MED ORDER — FUROSEMIDE 80 MG PO TABS: 80.0000 mg | ORAL_TABLET | Freq: Two times a day (BID) | ORAL | Status: DC

## 2010-06-05 NOTE — Telephone Encounter (Signed)
[address] °

## 2010-06-06 ENCOUNTER — Other Ambulatory Visit: Payer: Self-pay | Admitting: Cardiology

## 2010-06-06 MED ORDER — CARVEDILOL 6.25 MG PO TABS: 6.2500 mg | ORAL_TABLET | Freq: Two times a day (BID) | ORAL | Status: DC

## 2010-07-10 ENCOUNTER — Other Ambulatory Visit: Payer: Self-pay | Admitting: *Deleted

## 2010-07-22 NOTE — Assessment & Plan Note (Signed)
[facility]                            CARDIOLOGY OFFICE NOTE   CLEMENTE, DEWEY                      MRN:          [id number]  DATE:12/21/2007                            DOB:          03-29-1927    PRIMARY CARE PHYSICIAN:  Tinnie Gens A. Tawanna Cooler, MD   REASON FOR PRESENTATION:  Evaluate the patient with cardiomyopathy,  mitral stenosis, and atrial fibrillation.   HISTORY OF PRESENT ILLNESS:  The patient returns for followup of the  above.  He comes here from [place] 4 months at a time essentially to get  his healthcare.  When he is down there, he sees his primary care doctor.  He also now is getting his blood work checked routinely for his  Coumadin.  He is now reestablished in our Coumadin Clinic.  He has done  quite well.  He has had no palpitations, presyncope, or syncope.  He had  no chest discomfort, neck, or arm discomfort.  He has had no shortness  of breath, PND, or orthopnea.   PAST MEDICAL HISTORY:  1. Congestive heart failure (EF 25-35%).  2. 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  3. Mild aortic valve regurgitation.  4. Mild aortic stenosis.  5. Atrial fibrillation on chronic anticoagulation therapy.  6. Gout.  7. Arthritis.  8. Benign prostatic hypertrophy,  9. Gastroesophageal reflux disease.   ALLERGIES:  ACE caused cough.   MEDICATIONS:  1. Coumadin.  2. Digoxin 0.125 mg daily.  3. Potassium.  4. Coreg 6.25 mg b.i.d.  5. Furosemide 80 mg b.i.d.  6. Omeprazole.   REVIEW OF SYSTEMS:  As stated in the HPI and otherwise negative for all  other systems.   PHYSICAL EXAMINATION:  GENERAL:  The patient is in no distress.  VITAL SIGNS:  Blood pressure 110/58, heart rate 17 with irregular, and  weight 155 pounds.  HEENT:  Eyelids unremarkable.  Pupils are equal, round, and reactive to  light.  Fundi not visualized.  Oral mucosa unremarkable.  NECK:  No jugular distention at 45 degrees.  Carotid  upstroke brisk and  symmetric.  No bruits.  No thyromegaly.  LYMPHATICS:  No cervical, axillary, or inguinal adenopathy.  LUNGS:  Clear to auscultation bilaterally.  CHEST:  Unremarkable.  HEART:  PMI not displaced or sustained.  S1 and S2 within normal.  No  S3, 2/6 systolic murmur heard at the apex.  No diastolic murmurs.  ABDOMEN:  Mildly obese, positive bowel sounds, normal in frequency and  pitch, no bruits, no rebound, no guarding, no midline pulsatile mass.  No hepatomegaly or splenomegaly.  SKIN:  No rashes.  No edema.  EXTREMITIES:  2+ pulse throughout.  No cyanosis, no clubbing, no edema.  NEUROLOGIC:  Oriented to person, place, and time.  Cranial nerves II  through XII grossly intact.  Motor grossly intact.   ASSESSMENT/PLAN:  1. Atrial fibrillation.  The patient seems to have reasonable rate      control.  He is on Coumadin.  He understands the importance of      getting it measured.  He assures me  that he is getting it measured      in [place].  He gets it measured back here.  He is at very high risk      for stroke without this drug and so he will remain on Coumadin.  2. Mitral stenosis.  He seems to be asymptomatic.  No further      cardiovascular testing is suggested.  3. Cardiomyopathy.  He has class I symptoms.  However, for some      reason, he came off his [place]and.  I suspect it is hard to get in      [place].  I am going to renew this.  He tolerated it before.  He has      an ACE cough.  He will start on 4 mg a day.  4. Followup.  I will see him back in 1 year or sooner.  I would see      him back in 6 months if he happens to be in this country.     Rollene Rotunda, MD, [facility]  Electronically Signed    JH/MedQ  DD: 12/21/2007  DT: 12/22/2007  Job #: [id number]   cc:   Tinnie Gens A. Tawanna Cooler, MD

## 2010-07-22 NOTE — Assessment & Plan Note (Signed)
[facility]                            CARDIOLOGY OFFICE NOTE   Joseph Rios, Joseph Rios                      MRN:          [id number]  DATE:09/03/2006                            DOB:          Sep 23, 1927    REASON FOR VISIT:  Evaluate patient with heart failure.   HISTORY OF PRESENT ILLNESS:  The patient was recently admitted with an  exacerbation of heart failure. He lives half the time in [place] and half  the time here. It has been difficult to care for him. He had recently  stopped all of his medicines when he had some dental work done in  [place]. He presented on June 21 with right wrist pain and CHF. He was  felt to have acute arthritis and was treated with steroids. We were  involved and restarted some of those medications including IV diuresis.  He had his Coreg restarted in particular for control of his A fib with  rapid rate. Initially he was slightly hypotensive so his ACE inhibitor  was held.   Since going home, he has had no new shortness of breath and denies any  PND or orthopnea. He had some palpitations but nothing significant and  has no presyncope or syncope. He has not been having any chest  discomfort.   PAST MEDICAL HISTORY:  Mitral stenosis rheumatic status post  valvuloplasty Aug 04, 2001 by Dr. Donalee Citrin (echocardiogram this  admission demonstrated moderate rheumatic deformity with mild  regurgitation and a mean gradient of 7, mild aortic stenosis,  nonischemic cardiomyopathy, EF 25-35%), acute gout exacerbation of the  right wrist, permanent atrial fibrillation, benign prostatic  hypertrophy, gastroesophageal reflux disease.   ALLERGIES:  None.   MEDICATIONS:  1. Coumadin.  2. Digoxin 0.125 mg daily.  3. Protonix 40 mg daily.  4. Coreg 3.125 mg b.i.d.  5. Lasix 40 mg b.i.d.   REVIEW OF SYSTEMS:  As stated in the HPI and otherwise negative for  other systems.   PHYSICAL EXAMINATION:  GENERAL:  The patient is in no  distress.  VITAL SIGNS:  Blood pressure 140/80, heart rate 89 and irregular.  HEENT:  Eyelids unremarkable. Pupils equal round and reactive to light.  Fundi not visualized.  NECK:  No jugular venous distention at 45 degrees, carotid upstroke  brisk and symmetric, no bruits, no thyromegaly.  LYMPHATICS:  No adenopathy.  LUNGS:  Decreased breath sounds without wheezing or crackles.  BACK:  No costovertebral angle tenderness.  CHEST:  Unremarkable.  HEART:  PMI not displaced or sustained, S1 and S2 within normal limits,  no S3, 2/6 apical systolic murmur radiating slightly out the aortic  outflow tract, no appreciable diastolic murmurs.  ABDOMEN:  Obese, positive bowel sounds, normal in frequency and pitch,  no bruits, no rebound, no guarding, no midline pulsatile mass, no  organomegaly.  SKIN:  No rashes, no nodules.  EXTREMITIES:  2+ pulses, no edema.   EKG:  Atrial fibrillation, ectopic couplets, no acute ST-T wave changes,  T wave flattening.   ASSESSMENT/PLAN:  1. Heart failure. The patient's heart failure is difficult to treat  because he lives half the time here and half the time in [place]. We      also have problems with translator though they are in the room      today and very helpful. We have agreed that for now we will      continue the medications as listed though I think we can reduce his      Lasix to 40 mg once a day. He was on 20 mg once a day previously.      He is going to actually go up slightly on his Coreg to 6.25 mg      twice a day. We will have him come back to the heart failure clinic      for medication titration and probably to restart an ACE in a couple      of weeks.  2. Atrial fibrillation. This is very complicated. I have explained      very clearly to them the risk of stroke with his rheumatic heart      disease and atrial fibrillation and reduced ejection fraction. I      know he needs to be on Coumadin as long as he can get it followed.      As  long as he is in this country and we are following it, we can      continue his Coumadin and any problems he is to come back to the      clinic. Once he leaves for [place] however I am only going to give      him a 1 month supply of the Coumadin. He needs to find a Coumadin      prescribing physician in that time frame or he needs to stop the      drug and accept the risk of stroke. They understand this clearly      and they accept this logic. He is due to have his Coumadin checked      on Monday.  3. Followup. We will see the patient again in 2 weeks in the heart      failure clinic and sooner if he has any problems. We will see the      patient on Monday for a Coumadin check. When he comes back to the      heart failure clinic he should get a BMET.     Rollene Rotunda, MD, [facility]  Electronically Signed    JH/MedQ  DD: 09/03/2006  DT: 09/03/2006  Job #: [id number]

## 2010-07-22 NOTE — Assessment & Plan Note (Signed)
[facility]                          CHRONIC HEART FAILURE NOTE   JAXSIN, BOTTOMLEY                      MRN:          [id number]  DATE:10/07/2006                            DOB:          Jun 26, 1927    Mr. Osuna returns today for followup of his congestive heart failure  which is likely secondary to atrial arrhythmias (atrial fib with rapid  ventricular response) in the past.  Patient also has a history of  rheumatic heart disease and mitral stenosis status post valvuloplasty at  [facility] in 2003.  Mr. Snelgrove is a non-English speaking Hispanic who lives  here and then resides part of the year in [place] with the rest of his  family.  The last months or so I have been following him he has been  rather difficult to manage.  There is always as family member with him  who does the interpreting for him, but Mr. Lindner has a very poor  understanding of the significance of his heart failure and is inclined  to eat food containing higher amounts of sodium than he should when he  is here and also much more so when he is in [place].  Through family  interpreting, patient denies any shortness of breath, orthopnea or PND.  His weight has been stable at home per family.  No episodes of chest  discomfort.  He is still complaining of some soreness in his neck and  was apparently recently diagnosed with arthritis by Urgent Care  Physician.  Otherwise, he denies any new problems.  He will be here for  another month before he returns to [place] in September.   PAST MEDICAL HISTORY:  1. Congestive heart failure with an ejection fraction at 25-35% with      mild aortic valve stenosis, mild aortic valve regurgitation,      moderate rheumatic deformity of the mitral valve, moderate mitral      stenosis, mild mitral valve regurgitation.  The interatrial septum      bows from left-to-right, consistent with increased left arterial      pressure, per my echocardiogram.  2.  Status post valvuloplasty in 2003 at [facility].  3. Atrial fibrillation with rapid ventricular response.  4. Chronic anticoagulation therapy.  5. Acute gout exacerbations.  6. Arthritis.  7. BPH.  8. GERD>  9. Health illiteracy.  10.Communication barrier.  11.History of medical noncompliance secondary to multiple factors.  12.History of epididymitis.   REVIEW OF SYSTEMS:  As stated above.   CURRENT MEDICATIONS:  1. Coumadin per Coumadin Clinic.  2. Digoxin 0.125 mg daily.  3. Carvedilol 6.25 mg b.i.d.  4. Nexium 40 mg daily.  5. Lasix 80 mg b.i.d.  6. KCL 20 mEq daily.   PHYSICAL EXAM:  Weight 160, blood pressure 110/70, manual, left arm,  heart rate of 75.  Mr. Locker is in no acute distress.  No jugular vein  distention at 45 degree angle.  LUNGS:  Clear to auscultation bilaterally.  CARDIOVASCULAR EXAM:  S1 and S2, irregularly irregular, a 2/6 systolic  ejection murmur.  ABDOMEN:  Soft, nontender, obese, positive  bowel sounds.  LOWER EXTREMITIES:  Without clubbing, cyanosis or edema today.  NEUROLOGICALLY:  Patient alert and oriented x3.   IMPRESSION:  Stable heart failure at this time, no signs of volume  overload.  Will continue the Coreg at current dose.  I am going to start  patient back on his ACE inhibitor.  Will use Accupril 2.5 mg daily.  This was held during recent hospitalization secondary to hypotension.  Will see patient back in 3 weeks for re-evaluation, check blood work and  hopefully increase Coreg dose at that time if tolerates.  Will try and  have Medical Records transcribe a copy of my next note in Spanish for  patient to take with him when he returns to [place].      Dorian Pod, ACNP  Electronically Signed      Bevelyn Buckles. Bensimhon, MD  Electronically Signed   MB/MedQ  DD: 10/07/2006  DT: 10/08/2006  Job #: [id number]

## 2010-07-22 NOTE — Discharge Summary (Signed)
[name], [name]               ACCOUNT NO.:  [id number]   MEDICAL RECORD NO.:  [id number]          PATIENT TYPE:  INP   LOCATION:  4734                         FACILITY:  [facility]   PHYSICIAN:  Valerie A. Felicity Coyer, MDDATE OF BIRTH:  1927-07-08   DATE OF ADMISSION:  08/28/2006  DATE OF DISCHARGE:                               DISCHARGE SUMMARY   DISCHARGE DIAGNOSES:  1. Acute systolic congestive heart failure exacerbation, left      ventricular ejection fraction of 25-35% per 2-D echocardiogram.  2. Acute gout exacerbation right wrist.  3. Atrial fibrillation and rapid ventricular response.  4. Benign prostatic hypertrophy, stable.  5. Gastroesophageal reflux disease.   HISTORY OF PRESENT ILLNESS:  [name] is a 77 year old male who was  admitted on August 28, 2006, with fever, right wrist pain and CHF  exacerbation.  The patient is followed by Dr. Antoine Poche intermittently,  as he travels back and forth from [place] to [place].  He apparently  had developing weakness and shortness of breath on exertion.  He was  admitted for further evaluation and treatment.   PAST MEDICAL HISTORY:  1. Mitral stenosis status post mitral valvulectomy at [facility].  2. Cardiac catheterization with nonobstructive CAD 2003.  3. Chronic atrial fibrillation on Coumadin.   COURSE OF HOSPITALIZATION:  #1 - ACUTE SYSTOLIC CONGESTIVE HEART FAILURE  EXACERBATION.  The patient was admitted.  He was noted to have a left  ventricular ejection fraction per 2-D echocardiogram of 25-35%.  He was  also noted to have an elevated BNP.  He was diuresed with IV Lasix.  He  continued to diurese slowly and improved clinically.  Coreg was added to  his medication regimen.  Consider addition of ACE inhibitor as blood  pressure allows.  The patient apparently had been taking K-Dur 20 mEq  p.o. twice daily.  His potassium was low on admission and he received  repletion initially.  He continued to have stable potassium  levels  despite IV diuresis.  Potassium at time of discharge is 4.3 without any  standing supplementation.  We will continue to hold potassium supplement  at this time.  However, he will need followup electrolytes at his  appointment on Friday with Dr. Antoine Poche to further evaluate need for  ongoing potassium supplementation.  At this time the patient continues  to complain of right lower lobe chest tightness with deep inspiration  which is worse upon lying down.  Suspect this is likely  pleuritic/orthopnea related in nature.  We will check a quick chest x-  ray prior to discharge to rule out any underlying pulmonary etiology.  If negative, plan to discharge the patient to home.   #2 - GOUT RIGHT WRIST.  The patient was treated with steroids, which are  being tapered.  His gout symptoms are resolved at this time.  Plan to  continue prednisone for a total of 5 days.   MEDICATIONS AT TIME OF DISCHARGE:  1. Coumadin 2.5 mg p.o. daily except 5 mg on Mondays and Fridays.  2. Prednisone 10 mg p.o. b.i.d. June 25 and June 26, then stop.  3. Digoxin 0.125 mg p.o. daily.  4. Protonix 40 mg p.o. daily.  5. Coreg 3.125 mg p.o. b.i.d.  6. Lasix 40 mg p.o. b.i.d.   PERTINENT LABORATORIES AT TIME OF DISCHARGE:  BUN 21, creatinine 1.08,  sodium 138, potassium 4.3, amylase and lipase negative.  June 22, blood  cultures remain no growth to date x2.   FOLLOWUP:  The patient is scheduled to follow up with Dr. Rollene Rotunda  on June 27 at 1:45 p.m.  He will need followup BMET at that time to  evaluate his electrolytes.  In addition, he is scheduled in the Coumadin  Clinic on Monday, June 30 at 1:45 p.m.      Sandford Craze, NP      Raenette Rover. Felicity Coyer, MD  Electronically Signed    MO/MEDQ  D:  09/01/2006  T:  09/01/2006  Job:  [id number]   cc:   Rollene Rotunda, MD, [facility]

## 2010-07-22 NOTE — Consult Note (Signed)
NAMEARBOR, COHEN               ACCOUNT NO.:  [id number]   MEDICAL RECORD NO.:  [id number]          PATIENT TYPE:  INP   LOCATION:  4734                         FACILITY:  [facility]   PHYSICIAN:  Madolyn Frieze. Jens Som, MD, FACCDATE OF BIRTH:  1927/06/07   DATE OF CONSULTATION:  08/30/2006  DATE OF DISCHARGE:                                 CONSULTATION   [name] is a 77 year old patient of Dr. Leta Jungling Rios's with a past  medical history of mitral stenosis secondary to rheumatic heart disease  (status post valvuloplasty at [facility]), cardiomyopathy,  permanent atrial fibrillation, benign prostatic hypertrophy, and we are  asked to evaluate for congestive heart failure.  Note - the patient does  not speak English, and the history is obtained through an interpreter  and his daughter-in-law.  Also of note is that the patient spends the  majority of his time in [place] and comes here only for his health care.  His cardiac history dates back to a cardiac catheterization performed on  June 02, 2001.  This was performed secondary to heart failure.  The  patient was found to have an ejection fraction of 43%.  There was no  mitral regurgitation, but there was 1+ aortic insufficiency.  The left  main was normal.  There was no obstructive coronary disease noted.  He  was found to have severe mitral stenosis.  Of note, an echocardiogram  performed prior to that revealed normal LV function and mild mitral  stenosis with a mean gradient of 3.1 mmHg.  The patient ultimately was  seen at [facility] and underwent balloon valvuloplasty.  He has  since been followed by Dr. Antoine Rios.  He has not seen Dr. Antoine Rios in  some time.  Apparently, he stopped his medications 3 weeks ago.  Apparently, he was having some pain in his teeth and jaw swelling, but  the dentist in [place] did not want to see the patient until he was off  of his Coumadin.  Upon his return, his family noticed that he was more  dyspneic.  He was also having increased abdominal girth and swelling, as  well as pain in his stomach that increased with lying flat.  He  ultimately developed right wrist pain, and that was his main complaint  at the time of his presentation on August 28, 2006.  He was admitted for  the pain, as well as congestive heart failure, and has been treated.  Of  note, his wrist pain has been felt secondary to probable gout.  During  the admission, he has been diuresed.  Note, his BNP has been elevated  and on admission was 651.  Also note that his heart rate has been  elevated in the low 100 range.  His chest x-ray has revealed CHF.  Note  per the interpreter, the patient does have some dyspnea on exertion  according to his family, but he denies this.  There is no orthopnea or  PND, but he does have some increased chest discomfort and epigastric  fullness with lying flat.  There is no productive cough or  hemoptysis.  There is no dysuria or hematuria.   MEDICATIONS AT PRESENT:  1. Coumadin as directed.  2. Lasix 40 mg IV daily.  3. Prednisone 10 mg p.o. b.i.d.  4. Digoxin 0.125 mg p.o. daily.  5. Protonix 40 mg p.o. daily.  6. P.R.N. medications.   ALLERGIES:  1. Questionable allergy to PERSANTINE.  2. There is a question of history of cough with ACE INHIBITION.   PAST MEDICAL HISTORY:  1. Rheumatic heart disease as described above.  2. He is status post mitral valvuloplasty at [facility].  3. History of cardiomyopathy based on previous catheterization.  4. History of permanent atrial fibrillation.  5. Benign prostatic hypertrophy.   SOCIAL HISTORY:  He has remote history of tobacco use but has not smoked  in 25 years.  He lives in [place] with his wife.  He does not use  alcohol.   FAMILY HISTORY:  Negative for coronary artery disease in his immediate  family.   REVIEW OF SYSTEMS:  There are no headaches.  He has had low grade fever.  There are no chills.  Apparently, there is  no productive cough or  hemoptysis.  There is no dysphagia, odynophagia, melena or hematochezia.  He has not had dysuria or hematuria.  He does have dyspnea on exertion.  There is a question of orthopnea but there is no PND.  There is  increased abdominal girth but there is no pedal edema.  The remaining  systems are negative.  Again, this history is somewhat difficult due to  the language barrier.   PHYSICAL EXAMINATION:  GENERAL:  He is well-developed, well-nourished,  in no acute distress.  His skin is warm and dry.  He does not appear to  be depressed.  There is no peripheral clubbing.  BACK:  Normal.  HEENT:  Normal with normal eyelids.  NECK:  Supple with a normal upstroke bilaterally.  There are no bruits  noted.  I cannot appreciate jugular venous distension or thyromegaly.  CHEST:  Clear to auscultation.  Normal expansion.  CARDIOVASCULAR:  Tachycardic rate and irregular rhythm.  I cannot  appreciate murmurs, rubs or gallops.  I cannot palpate a PMI.  ABDOMEN:  Mild tenderness in the epigastric area.  I cannot appreciate  hepatosplenomegaly or masses.  There is no abdominal bruit.  He has 2+  femoral pulses bilaterally.  No bruits.  EXTREMITIES:  No edema.  I can palpate no cords.  He has 2+ dorsalis  pedis pulses bilaterally.  NEUROLOGIC:  Grossly intact.   LABORATORY DATA:  A sed rate today of 33.  Blood cultures are negative  to date.  His BUN and creatinine are 18 and 0.95 with a potassium 3.6.  His hemoglobin and hematocrit are 14.9 and 43.2 respectively.  BNP  yesterday was 542.  A digoxin level was less than 0.2.  Liver functions  were normal.  Chest x-ray showed edema.  There is marked cardiomegaly.  His echocardiogram done today reveals an ejection fraction of 25-35%.  There was mild aortic stenosis with a mean gradient of 12 mmHg.  There  was also mild aortic insufficiency.  The mitral valve was rheumatic and there was mild mitral regurgitation.  The mitral valve  area by pressure  half-time was 1.3 cm2, and this was felt to be consistent with moderate  mitral stenosis.  The left atrium is markedly dilated and the right  atrium is moderately to markedly dilated.  There was a small pericardial  effusion.  DIAGNOSES:  1. Congestive heart failure - this appears to be due to a combination      of his valvular heart disease, reduced left ventricular function,      atrial fibrillation with elevated heart rate and noncompliance.  I      would recommend continuing with diuretics, and he appears to be      improving on the Lasix at 40 mg daily.  We need to follow his renal      function closely.  I would also continue his digoxin for his      decreased left ventricular function.  We will add Coreg at 3.125 mg      p.o. b.i.d. and increase this as tolerated.  Ultimately, he would      also benefit from an ACE inhibitor or ARB if his blood pressure      will allow.  There is a question of whether he has had a cough with      ACE inhibitors in the past.  2. Atrial fibrillation - his heart rate is elevated today.  He has had      his digoxin resumed and we will add Coreg which should also help      with the rate.  Ultimately, he would most likely require a 24-hour      Holter monitor to make sure that his rate is controlled after      titration of his medications.  This certainly could contribute to a      cardiomyopathy long-term.  He will continue on his Coumadin at      present.  He does state that he has this checked routinely in      [place].  3. Cardiomyopathy - he will need a follow-up echocardiogram in 6      months.  If his left ventricular function remains decreased, then      he would benefit from an ICD if it is in the 25-30% range.  Note      there would be problems from a social standpoint in terms of follow-      up.  4. Valvular heart disease.  He will also need follow-up      echocardiograms in the future to assess his mitral stenosis and       aortic stenosis and therapy as indicated.  5. Gout - per the primary care service.  We will be happy to follow      while he is in the hospital.      Madolyn Frieze. Jens Som, MD, [facility]  Electronically Signed     BSC/MEDQ  D:  08/30/2006  T:  08/31/2006  Job:  [id number]

## 2010-07-22 NOTE — Assessment & Plan Note (Signed)
[facility]                          CHRONIC HEART FAILURE NOTE   NAME:Joseph Rios, Joseph Rios                      MRN:          [id number]  DATE:09/14/2006                            DOB:          01-06-1928    SUBJECTIVE:  Joseph Rios is new to the heart failure clinic.  He is a  very pleasant elderly Hispanic gentleman who does not speak [place], who  presents today for followup of his congestive heart failure, which is  most likely secondary to his atrial fibrillation with rapid ventricular  response in the past.  Joseph Rios also has a history of rheumatic heart  disease and mitral stenosis.  He is status post valvuloplasty in 2003,  at [facility].  Apparently Joseph Rios lives here in the  [place] for about six months out of the year.  Then he returns to  his native country of [place].  This causes much confusion, as he is not  followed by English-speaking physicians in [place] and is unable to  obtain some of the medicines that he requires when in [place].  Unfortunately, he is also on anticoagulation therapy and his Coumadin  levels are often not followed when he is in [place], where he apparently  does not have any way to pay for his medical expenses.  Here in the  [place] he is able to receive Medicaid assistance.  Communication  has been a major barrier in the past.  We rely on the assistance of a  translator or his family to interpret.  Needless to say, communication  is a major deficit.  Joseph Rios is accompanied by his daughter-in-law  today, who is translating for him.  I have asked her to specifically use  the words that I use to translate, so that he may answer the questions  appropriately.  Through her, he is still complaining of abdominal  fullness and dyspnea on exertion.  He is denying shortness of breath at  rest.  His appetite has been good.  Urinating and having bowel movements  without problems.  No hematuria or  melena.  Denies any orthopnea or PND.  He does complain of fullness quicker after eating.  He does complain of  very mild peripheral edema.  He does complain that his clothes are snug.  He denies any chest pain, pre-syncope or syncopal episodes,  lightheadedness or dizziness, nausea or vomiting.  He states compliance  with his medications.  (This daughter-in-law is the one who does his  medications for him.)  He has not had any problems in regards to his  Coumadin with palpitations.   PAST MEDICAL HISTORY:  1. Recent hospitalization for exacerbation of congestive heart      failure.  An echocardiogram at that time showed an ejection      fraction of 25%-35%, with mild aortic valve stenosis, mild aortic      valve regurgitation, moderate rheumatic deformity of the mitral      valve, moderate mitral stenosis, mild mitral valve regurgitation.      The inter-atrial septum bows from left to right, consistent  with      increased left arterial pressure.  The patient had a small      pericardial effusion.  2. Atrial fibrillation with a rapid ventricular response.  3. Anticoagulation therapy.  4. Acute gout exacerbation during recent hospitalization.  5. Benign prostatic hypertrophy.  6. Gastroesophageal reflux disease.  7. Health illiteracy.  8. Communication barrier.  9. History of medical noncompliance, secondary to multiple factors,      including communication barrier, socioeconomic deficits and patient      being out of the country 50% of the year.  10.History of rheumatic heart disease, status post valvuloplasty at      [facility] in 2003, secondary to mitral stenosis.  11.History of epididymitis.   REVIEW OF SYSTEMS:  As stated above, otherwise negative as reviewed  through the patient's daughter-in-law.   MEDICATIONS:  1. Coumadin per the Coumadin Clinic.  2. Digoxin 0.125 mg daily.  3. Protonix 40 mg daily.  4. Coreg 6.25 mg b.i.d.  5. Lasix 40 mg b.i.d.   PHYSICAL EXAMINATION:   VITAL SIGNS:  Weight 162 pounds, blood pressure  134/79, heart rate 48, pulse rechecked at 62 apical.  GENERAL:  Joseph Rios does not appear to be in any acute distress.  He is  pleasant, smiling, cooperative.  NECK:  Jugular venous distention is 8 to 10 cm at a 45-degree angle.  LUNGS:  Clear to auscultation.  CARDIOVASCULAR:  Reveals S1 and S2, irregularly irregular.  I do not  hear any gallops.  He has a 2/6 systolic ejection murmur.  ABDOMEN:  Soft, nontender.  Positive bowel sounds, distended.  EXTREMITIES:  Lower extremities:  The patient has 2+ pitting edema  bilaterally.   IMPRESSION:  Patient with congestive heart failure, with a 5 pound  weight gain since September 03, 2006, with significant volume overload in his  abdomen and lower extremities.   PLAN:  Adjustments were made in his diuretic at the last appointment.  Will need to titrate back up on this.  I am going to have his daughter-  in-law give him 80 mg of Lasix in the a.m. for the next two mornings and  40 mg in the afternoon.  Then we will go back to 40 mg b.i.d.  I am  going to see him back next week and check laboratory work at that time.  Continue the carvedilol at 6.25 mg b.i.d.  The patient's heart rate will  not tolerate further titration at this time.  Will continue other  medications.  Will have PT and INR checked today and have the Coumadin  Clinic adjust the dose.  The patient cannot get the Protonix filled.  I  will have write them a prescription for Nexium.  Hopefully Medicaid will  cover this.  If not, I have asked the daughter-in-law to call me and I  will switch him to whatever they will cover.   FOLLOWUP:  I will see Joseph Rios back next week for re-evaluation and  for blood work.      Dorian Pod, ACNP  Electronically Signed      Rollene Rotunda, MD, [facility]  Electronically Signed   MB/MedQ  DD: 09/14/2006  DT: 09/15/2006  Job #: [id number]

## 2010-07-22 NOTE — Assessment & Plan Note (Signed)
[facility]                          CHRONIC HEART FAILURE NOTE   NAME:SANTOSZachariah, Pavek                      MRN:          [id number]  DATE:11/25/2006                            DOB:          06/27/1927    PRIMARY CARE PHYSICIAN:  The patient is followed by urgent care. He has  no known primary care physician.   PRIMARY CARDIOLOGIST:  Dr. Rollene Rotunda.   Mr. Strauch returns today accompanied by his daughter-in-law for follow  up of his congestive heart failure in the setting of chronic atrial  fibrillation and valve disease. His congestive heart failure is most  likely secondary to his atrial arhythmia. Mr. Maddocks has been having  some problems since I last saw him. He had some excruciating upper  extremity discomfort. His daughter-in-law states that she had to take  him to urgent care to get evaluated. He was x-rayed and was told that he  had arthritic changes. He had previously been diagnosed with arthritis  in his back also. This has caused him a significant amount of  discomfort. She states that he was placed on prednisone, Flexeril, pain  medication, and Motrin. This seemed to help ease the discomfort some,  but most concerning is a cough that he has and that has gone on for 3 or  4 weeks now. He tried Delsym over-the-counter which I had recommended at  one time after evaluating him. She states that this seemed to help  temporarily, but now they cannot tell any difference. Mr. Shakoor resides  here in the states for half of the year and then returns to [place] for  the other half of the year. I asked his daughter-in-law if she knew if  he had ever had a PPD test or a recent chest x-ray. She did not think  that he has had either one. She states that he has not complained of any  fevers or chills. He is not coughing up. It is a frequent irritating dry  cough. Also of note, I started him back on an ACE inhibitor in July. The  possibility of this  could be contributing to his cough also. Otherwise,  he denies any orthopnea, PND, pre-syncope, or syncopal episodes. His  only complaint is that of his arthritic and cough per interpretation of  his daughter-in-law.   PAST MEDICAL HISTORY:  1. Congestive heart failure with EF 25% to 35% with mild aortic valve      stenosis, mild aortic valve regurgitation, moderate rheumatic      deformities of the mitral valve, moderate mitral stenosis, and mild      to moderate valve regurgitation, status post valvuloplasty in 2003      secondary to rheumatic heart disease.  2. Permanent atrial fibrillation.  3. History of medical non-compliance.  4. Chronic anticoagulation therapy.  5. Acute gout exacerbations.  6. Generalized arthritis.  7. BPH.  8. GERD.  9. Health illiteracy.  10.Communication barrier.  11.History of medical non-compliance secondary to multiple factors as      stated above.   REVIEW OF SYSTEMS:  As stated above.  CURRENT MEDICATIONS:  1. Coumadin per the Coumadin clinic here.  2. Digoxin 0.125 mg daily.  3. Carvedilol 6.25 mg b.i.d.  4. Nexium 40 mg daily.  5. Lasix 80 mg b.i.d.  6. KCL 20 mEq daily.  7. Accupril 2.5 mg daily.   PHYSICAL EXAMINATION:  VITAL SIGNS:  Weight 163. This is up two pounds.  Blood pressure 132/72 with a heart rate of 52.  GENERAL:  Mr. Trouten is in no acute distress.  NECK:  No jugular vein distension at 45 degrees.  LUNGS:  Clear to auscultation throughout. I do not hear any wheezing,  rales, or rhonchi.  CARDIOVASCULAR:  S1, S2. Irregular rate with a 2/6 systolic ejection  murmur noted.  ABDOMEN:  Soft and nontender, positive bowel sounds.  LOWER EXTREMITIES:  Without cyanosis, clubbing, or edema at this time.   IMPRESSION:  Congestive heart failure without signs of volume overload  today. However, the patient is with ongoing chronic cough that is non-  productive.   PLAN:  At this time, I am going to stop his ACE inhibitors and  switch  him to Atacand 4 mg p.o. daily. We will obtain a PPD test and a PA and  lateral chest x-ray, as well as a BMET and a BNP, although I do not  think that the cough is coming from his heart failure. It most certainly  could be coming from his ACE inhibitor. We will see if the cough  improves off of the ACE inhibitor. I have also given him a prescription  for 5 cc of Tussionex to take at bedtime. I have also given him a  prescription for prophylactic antibiotic therapy. He is going to have to  have three teeth pulled. This has already been arranged by his family.  His daughter-in-law states that the dentist he is seeing states that he  does not need to come off of his Coumadin for this. We will see the  patient back after diagnostics are obtained or if his condition changes.      Dorian Pod, ACNP  Electronically Signed      Bevelyn Buckles. Bensimhon, MD  Electronically Signed   MB/MedQ  DD: 11/25/2006  DT: 11/26/2006  Job #: [id number]

## 2010-07-22 NOTE — Assessment & Plan Note (Signed)
[facility]                          CHRONIC HEART FAILURE NOTE   KARAM, DUNSON                      MRN:          [id number]  DATE:09/21/2006                            DOB:          04-Mar-1928    Mr. Gutzmer returns today for further follow-up of his congestive heart  failure which is most likely secondary to his atrial fibrillation with  rapid ventricular response in the past.  He also has a history of  rheumatic heart disease and mitral stenosis, status post valvuloplasty  in 2003 at [facility].  Mr. Kazmi is difficult to manage secondary to  communication barrier and living arrangement.  He is here in the [place] six months out of the year and then in [place] for the other six  months.  Dietary indiscretion is a major compromising factor for him  along with access to medications when he is out of the country.  Here,  he relies on his family for interpreting for him as he does not speak  any [place].  Family accompanied him, it is usually a sister-in-law who  does the cooking and arrange his medications for him, but also Mr.  Gluth is consuming high sodium meals that he prepares himself when  family is not around, such as yesterday he had a bowl of the Cup-of-  Soup which is known to have a very high sodium content.  He also had  pork and rice.  These were foods that he fixed himself.  When I saw him  last week, I increased his diuretic to 80 mg in the morning and 40 in  the evening for two days and then back to 40 mg b.i.d.  Family states he  did take the medication as prescribed and his weight dropped about 4  pounds during that time but has slowly creeped back up.  He is now up 6  pounds per the weights documented from home but he is actually down 2  pounds on the weights documented here in the office.  Mr. Marchio is also  complaining of a throbbing pain in his neck and his ears.  He states his  neck feels very stiff (per his  daughter-in-law's report).  He also  complains of short of breath, orthopnea, abdominal swelling.  Denies any  episodes of chest discomfort.   PAST MEDICAL HISTORY:  1. Congestive heart failure with an ejection fraction 25-30% secondary      to nonischemic cardiomyopathy, also with known mitral stenosis.  2. Permanent atrial fibrillation, status post valvuloplasty in 2003      secondary to rheumatic heart disease.  3. History of medical noncompliance.   REVIEW OF SYSTEMS:  As stated above.   CURRENT MEDICATIONS:  1. Coumadin per Coumadin Clinic.  2. Digoxin 0.125 mg daily.  3. Lasix 40 mg b.i.d.  4. Carvedilol 6.25 mg b.i.d.  5. Nexium 40 mg daily.   PHYSICAL EXAMINATION:  VITAL SIGNS:  Weight 160, blood pressure 122/74,  pulse 78.  GENERAL APPEARANCE:  Mr. Vecchio is in no acute distress, very pleasant,  cooperative gentleman.  NECK:  He has JVD 10-12 cm at a 45 degree angle.  CARDIOVASCULAR:  S1 and S2, irregular, 2/6 systolic ejection murmur.  ABDOMEN:  Soft, distended, positive bowel sounds.  EXTREMITIES:  Lower extremities with 1+ pitting edema bilaterally.  NEUROLOGIC:  He is alert and oriented x3.   IMPRESSION:  Congestive heart failure secondary to nonischemic  cardiomyopathy with ejection fraction 25-35% with volume overload at  this time.  I am going to increased the patient's diuretic.  Lasix will  be 80 mg in the morning and 40 in the evening.  Will continue carvedilol  at current dose.  Continue to hold on ACE inhibitor as patient has  volume overload at this time and ACE inhibitor was held during recent  hospitalization secondary to hypotension.  I will check labs on patient  and see him back within the next two weeks for further evaluation.      Dorian Pod, ACNP  Electronically Signed      Rollene Rotunda, MD, [facility]  Electronically Signed   MB/MedQ  DD: 09/21/2006  DT: 09/22/2006  Job #: [id number]

## 2010-07-22 NOTE — H&P (Signed)
NAMEBARD, HAUPERT NO.:  [id number]   MEDICAL RECORD NO.:  [id number]          PATIENT TYPE:  INP   LOCATION:  4734                         FACILITY:  [facility]   PHYSICIAN:  Theressa Millard, M.D.    DATE OF BIRTH:  February 19, 1928   DATE OF ADMISSION:  08/28/2006  DATE OF DISCHARGE:                              HISTORY & PHYSICAL   HISTORY OF PRESENT ILLNESS:  [name] is a 77 year old Hispanic male  admitted with fever, congestive heart failure, and  acute monoarthritis.   [name] has a history of mitral stenosis, probably on the basis of  rheumatic heart disease.  He underwent a mitral valvulotomy at [facility] in  2003.  He is followed intermittently by Dr. Antoine Poche, but has not seen  him in over a year.  The patient has been in [place] for most of the past  year but returned approximately 3-4 weeks ago.  At that time family  members noted that he seemed to be weak and had shortness of breath on  exertion, but the patient denies edema, paroxysmal nocturnal dyspnea,  and orthopnea.  He does have a feeling of pressure in his stomach that  comes up into the chest.  For the last two months he has had pain in his  teeth and some jaw swelling over the last couple of days.  He went to  see a dentist 3-4 weeks ago in [place] before his return to the [place], but the dentist would not do anything because the patient is off  Coumadin.   Last night he developed pain in the right wrist.  This is extremely  painful and he has never had anything like this in the past..  He denies  any injury or fall.  He comes to the emergency department predominantly  because of the wrist discomfort.   PAST MEDICAL HISTORY:  Surgical:  None.  Medical:  1. Mitral stenosis, status post mitral valvulotomy at [facility].  At that      time EF was approximately 40%.  2. Calf with nonobstructive coronary artery disease in 2003.  3. Chronic atrial fibrillation on Coumadin, although he has not had a     pro time in over a year.  4. BPH.   SOCIAL HISTORY:  He does not smoke, having quit approximately 25 years  ago.  He is married and his wife is in [place].  He lives here with his  son.  Transportation is provided by his daughter-in-law.   FAMILY HISTORY:  Not obtained.   REVIEW OF SYSTEMS:  Denies joint pains in any other joints.  Denies  headaches.  No rash.  No recent tick bite.  All other systems are  negative.   PHYSICAL EXAMINATION:  GENERAL:  Well-developed, well-nourished, in no  significant distress.  He had received morphine prior to my arrival.  VITAL SIGNS:  Temperature 101.4.  Blood pressure 145/77, pulse 66  palpable, but 108 on EKG.  Respiratory rate 22.  HEENT:  The eyes have pupils which are round and reactive to light.  Extraocular movements are intact.  Nose and throat reveal poor  dentition.  NECK:  Supple.  Thyroid is not enlarged or tender.  CHEST:  Reveals rales at the bases.  CARDIAC:  Has a normal S1/S2 without S3, S4, murmur, rub, or click that  I could hear.  ABDOMEN:  Soft, slightly distended, but nontender.  Bowel sounds are  normal.  EXTREMITIES:  Without cyanosis, clubbing, or edema.  MUSCULOSKELETAL:  Reveals pain and swelling with slight warmth in the  right wrist.  I am unable to bend the wrist passively in almost any  direction.  Movement of the fingers is painful.  He seems to be mainly  tender over the dorsum and on the ulnar side of the wrist.  NEUROLOGIC:  Cranial nerves are intact.  Motor is 5/5.   LABORATORY DATA:  Troponin is borderline positive at 0.08.  CK-MB is  negative.  BNP 651.  White count 9600.  Hemoglobin 15.5.  INR 1.3.  Sodium 135,. Potassium 3.2, chloride 102, carbon dioxide 25, glucose 97,  BUN 8.  Liver function tests normal.  UA negative.  Uric acid is  pending.   Chest x-ray reveals cardiac enlargement with a pattern consistent with  pulmonary edema.  Examination of the wrist reveals no fracture.  There  are no  erosions consistent with chronic gout.  There is marked vascular  calcification.   EKG shows atrial fibrillation with a rate of 108.  There are no acute  changes.   IMPRESSION:  1. Acute monoarthritis with fever.  This involves the right wrist and      is slightly crystal-induced.  I will start the patient on low dose      prednisone for the next five days.  2. Congestive heart failure, ?type.  He has an enlarged heart with a      pulmonary edema pattern and elevated BNP.  Will give the patient      Lasix IV for the next four days and then he will need a chronic      oral dose.  3. Atrial fibrillation with rapid ventricular rate.  Will check      Lanoxin level and resume Lanoxin.  4. Systemic anticoagulation.  The patient has had no blood tests in      the last year and unfortunately his INR is only 1.3.  Will ingest      Coumadin per pharmacy.  5. Mitral stenosis, secondary to rheumatic heart disease, status post      valvulotomy.  6. Fever.  No other source noted except monoarthritis described above.  7. Benign prostatic hypertrophy by history.  8. Hypokalemia.  Potassium ordered.      Theressa Millard, M.D.  Electronically Signed     JO/MEDQ  D:  08/28/2006  T:  08/28/2006  Job:  [id number]   cc:   Rollene Rotunda, MD, [facility]

## 2010-07-22 NOTE — Assessment & Plan Note (Signed)
[facility]                          CHRONIC HEART FAILURE NOTE   Joseph Rios, Joseph Rios                      MRN:          [id number]  DATE:12/16/2006                            DOB:          Dec 18, 1927    [name] has no primary care physician.  He usually is seen by the  Urgent Care at [facility].   PRIMARY CARDIOLOGIST:  Joseph Rotunda, MD, [facility].   [name] returns today accompanied by his daughter-in-law for further  followup of his congestive heart failure in the setting of chronic  atrial fibrillation and valve disease.  I last saw [name] back in  September at which time he presented with complaints of a nonproductive  cough.  I obtained a chest x-ray that showed no acute findings, blood  work, and arranged for him to have a PPD test.  I recommended that he  try Delsym cough syrup over-the-counter to see if there was any  resolution in his cough.  He returns today.  No relief with the Delsym.  Cough continues.  PPD  test was negative.  Lab work unremarkable.  Chest x-ray okay.   He denies any fever or chills.  No productive sputum with cough.  He  denies any wheezing, orthopnea, PND.   PAST MEDICAL HISTORY:  1. Congestive heart failure with an EF of 25-35% with mild aortic      valve stenosis, mild aortic valve regurgitation, moderate rheumatic      deformity of the mitral valve, moderate mitral stenosis, mild-too-      moderate valve regurgitation, status post valvuloplasty in 2003,      secondary to rheumatic heart disease.  2. Atrial fibrillation.  3. Chronic anticoagulation therapy.  4. History of medical noncompliance secondary to multiple factors      including health illiteracy, communication barrier, and a      socioeconomic status.  5. Gout.  6. Arthritis.  7. Benign prostatic hypertrophy.  8. Gastroesophageal reflux disease.   REVIEW OF SYSTEMS:  As stated above.   CURRENT MEDICATIONS:  1. Coumadin per Coumadin  Clinic.  2. Digoxin 0.125.  3. Carvedilol 25 b.i.d.  4. Nexium 40.  5. Accupril 2.5 daily.  6. Potassium 20 mEq daily.  7. Lasix 80 mg daily.   REVIEW OF SYSTEMS:  As stated above.   PHYSICAL EXAMINATION:  VITAL SIGNS:  Weight 162, blood pressure 121/74,  once I documented pulse of 46, however, EKG shows a heart rate of 83.  A  12-lead EKG with atrial fib at a rate of 83.  GENERAL:  [name] is in no acute distress.  NECK:  He has no signs of jugular venous distention  at a 45-degree  angle.  LUNGS:  Clear to auscultation.  I do not hear any wheezing, rhonchi, or  rales.  CARDIOVASCULAR:  Reveals an S1 and S2.  Irregular rate with a 2/6  systolic ejection murmur.  ABDOMEN:  Soft and nontender.  Positive bowel sounds.  LOWER EXTREMITIES:  Without clubbing, cyanosis, or edema.  NEUROLOGIC:  He is alert, ambulating without assistance here in  the  clinic.   IMPRESSION:  1. Congestive heart failure without sings of volume overload with      ongoing nonproductive cough.  A recent workup was unremarkable.  I      suspect that his ACE inhibitor is causing his cough.  I am going to      have him discontinue the Accupril.  I have started him on Atacand 4      mg daily.  2. He has also complained of arthritic pain.  I have asked him to use      Tylenol Arthritis or Naprosyn for the discomfort.   We will see him back in a few weeks to see if his cough has resolved.  If not, we will refer him to pulmonology for further evaluation.  The  patient is also due for a routine cardiology visit with Dr. Antoine Rios, we  will schedule this for the next appointment.      Dorian Pod, ACNP  Electronically Signed      Joseph Rotunda, MD, [facility]  Electronically Signed   MB/MedQ  DD: 12/16/2006  DT: 12/16/2006  Job #: [id number]

## 2010-07-22 NOTE — Assessment & Plan Note (Signed)
[facility]                          CHRONIC HEART FAILURE NOTE   Joseph Rios, Joseph Rios                      MRN:          [id number]  DATE:10/28/2006                            DOB:          May 24, 1927    [name] returns today accompanied by his daughter-in-law for followup  of his congestive heart failure in the setting of chronic atrial fib and  valve disease.  His congestive heart failure is most likely secondary to  atrial arrhythmia.  [name] has been doing quite well since I last  saw him in July.  His family states that his weight has been stable at  home, tolerating medications without problems.  He is complaining of  pain in his feet and ankles.  Previously was complaining of pain in his  neck.  Was evaluated by urgent care and told that he had arthritis.  He  has told his family that the pain in his feet is similar to the pain  that he had in his neck.  He has taken Tylenol at home.  This seems to  alleviate the discomfort mildly.  He does have stronger pain medication  that he was prescribed at urgent care, but he does not like to take  this.  [name] had previously planned on returning home to [place] for  the winter, but now his family is trying to arrange for [name]' wife  to get a visa and come to the [place] to live.  If that is the  case, then he will not be returning home to [place] for half of the year,  but he will reside in [place] all of the time, which will make  managing his heart failure much easier.   PAST MEDICAL HISTORY:  1. 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  The interatrial septal bows from left to      right, consistent with increased left arterial pressure per      echocardiogram.  2. Status post  valvuloplasty in 2003 at [facility].  3. Atrial fib with a history of rapid ventricular response.  4. Chronic anticoagulation therapy.  5. Acute gout exacerbations.  6. Arthritis.  7. BPH.  8. GERD.  9. Health illiteracy.  10.Communication barrier.  11.History of medical noncompliance secondary to multiple factors.  12.History of epididymitis.   REVIEW OF SYSTEMS:  As stated above.   CURRENT MEDICATIONS:  1. Coumadin.  2. Digoxin 0.125.  3. Carvedilol 6.25 b.i.d.  4. Accupril 2.5 mg daily.  5. KCL 20 mEq daily.  6. Lasix 80 mg b.i.d.   PHYSICAL EXAMINATION:  Weight 161.  Blood pressure 134/60, heart rate  recorded at 45.  Apical pulse 62.  [name] is in no acute distress.  No signs of jugular venous  distention at 45 degree angle.  LUNGS:  Clear to auscultation.  CARDIOVASCULAR:  S1  and S2.  Irregular rate.  ABDOMEN:  Soft and nontender.  Positive bowel sounds.  LOWER EXTREMITIES:  Without any clubbing or cyanosis.  No edema at this  time.  NEUROLOGIC:  He is alert and oriented x3.  Up ambulating in the clinic  without assistance.   IMPRESSION:  Congestive heart failure in the setting of atrial  arrhythmias with history of rapid ventricular response.  Atrial  fibrillation rate well controlled at this time.  No signs of volume  overload.  Will continue current medications.  I am going to leave his  carvedilol at 6.25 mg b.i.d. for now, as his apical is in the low 60s.  I planned on checking lab work today, but the lab staff has gone from  the office at this time.  Daughter-in-law will bring patient back in the  morning to have lab work done.  I will also check a uric acid level at  that time, as the patient is complaining of the pain in his feet and  ankles.  I will see the patient back in one month.      Dorian Pod, ACNP  Electronically Signed      Bevelyn Buckles. Bensimhon, MD  Electronically Signed   MB/MedQ  DD: 10/28/2006  DT: 10/29/2006  Job #: [id number]

## 2010-07-22 NOTE — Assessment & Plan Note (Signed)
[facility]                            CARDIOLOGY OFFICE NOTE   IVON, Joseph Rios                      MRN:          [id number]  DATE:01/18/2007                            DOB:          Mar 14, 1927    PRIMARY CARE PHYSICIAN:  Tinnie Gens A. Tawanna Cooler, MD   REASON FOR PRESENTATION:  Evaluate patient with cardiomyopathy and  mitral stenosis.   HISTORY OF PRESENT ILLNESS:  The patient returns today for routine  followup. He has been followed closely in the Heart Failure Clinic. He  has been doing well since he was last seen there. He had a cough at that  time and had his ACE inhibitor stopped and was started instead on  Atacand. His cough has now resolved. He has no new symptoms. In  particular, he thinks his breathing is actually better. He is having no  shortness of breath. Denies any PND or orthopnea. He is not having any  chest or arm discomfort. He has some posterior neck discomfort which is  thought to be related to cervical disc disease.   He is going to be here in this country probably until December. He will  then travel back to [place] for a few months to see his wife who is  unable to get a Visa to stay here.   PAST MEDICAL HISTORY:  1. Congestive heart failure (ejection fraction of 25-35%).  2. 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  3. Mild aortic valve regurgitation.  4. Mild aortic stenosis.  5. Atrial fibrillation, on chronic anticoagulation therapy.  6. Gout.  7. Arthritis.  8. Benign prostatic hypertrophy.  9. Gastroesophageal reflux disease.   ALLERGIES:  ACE CAUSED A COUGH.   MEDICATIONS:  1. Coumadin.  2. Digoxin 0.125 mg daily.  3. Nexium 40 mg daily.  4. Potassium 20 mEq daily.  5. Atacand 4 mg daily.  6. Coreg 6.25 mg b.i.d.  7. Furosemide 80 mg b.i.d.   REVIEW OF SYSTEMS:  As stated in the HPI and otherwise negative for  other systems.   PHYSICAL EXAMINATION:  The patient is in no  distress. Blood pressure is  114/63, heart rate 58 and irregular. Weight 164 pounds.  NECK: No jugular venous distention at 45 degrees. Carotid upstroke brisk  and symmetrical. No bruits, no thyromegaly.  LYMPHATICS: No adenopathy.  LUNGS: Clear to auscultation bilaterally.  CHEST: Unremarkable.  HEART: PMI not displaced or sustained. S1, S2 within normal limits. No  S3.  A 2/6 systolic murmur heard at the apex. No diastolic murmur.  ABDOMEN: Obese, positive bowel sounds, normal in frequency and pitch. No  bruits. No rebounds. No guarding. No midline pulsatile mass. No  organomegaly.  SKIN: No rashes, no nodules.  EXTREMITIES: 2+ pulses throughout. No edema, cyanosis or clubbing.  NEURO: Oriented to person, place and time. Cranial nerves II-XII grossly  intact. Motor grossly intact throughout.   ASSESSMENT/PLAN:  1. Atrial fibrillation. The patient seems to have reasonable rate      control. He is very compliant with his medications when he is here.  Going to [place], we have had lots of discussion about the      importance of getting his Coumadin checked. He knows it is probably      more dangerous to continue the Coumadin not having it followed when      he is down there than discontinuing it. Either way, he is at risk      for bleed with it. He is a strong risk for thromboembolism without      it. We have had this discussion at great length.  2. Mitral stenosis. This seems to be asymptomatic at this point. He      will continue with medications as listed.  3. Cardiomyopathy. The patient is doing well. He has Class I symptoms.      No further cardiovascular testing or change in his medications is      suggested.  4. Followup. Will see the patient back in about six months or sooner      if needed.     Rollene Rotunda, MD, [facility]  Electronically Signed    JH/MedQ  DD: 01/18/2007  DT: 01/19/2007  Job #: [id number]

## 2010-07-25 NOTE — H&P (Signed)
[place]  Patient:    Joseph Rios, Joseph Rios Visit Number: [id number] MRN: [id number]          Service Type: OBV Location: 5100 [id number] Attending Physician:  Joseph Rios Dictated by:   Joseph Rios, M.D. [facility] Admit Date:  08/08/2001   CC:         Joseph Rios, M.D.   History and Physical  CHIEF COMPLAINT:  Left groin pain.  HISTORY OF PRESENT ILLNESS:  Joseph Rios is a 77 year old Hispanic male, followed by Joseph Rios for primary care, [facility], Joseph Rios for urology, who was recently treated at [facility] for mitral stenosis with a valvulotomy.  The patient developed left-sided pain radiating from his groin to his chest on Saturday, Aug 06, 2001, and was seen at the [facility] Emergency Department.  At that time cardiac enzymes, CBC, and CMET were all normal.  A chest x-ray was normal.  The patient was sent home on ibuprofen.  He presents to [employer] [facility] Emergency Department today with ongoing pain.  The initial evaluation including a CT scan of the abdomen and pelvis, which was negative for retroperitoneal hematoma, was positive for a markedly abnormal urinalysis with hematuria, pyuria, and bacteria.  On examination he is hot to the touch.  He has tenderness in the left lower quadrant and a markedly swollen, firm, and tender left testicle and epididymis.  He is now admitted for IV antibiotics and a GU workup.  PAST MEDICAL HISTORY 1. History of rheumatic fever. 2. History of congestive heart failure, secondary to above. 3. History of atrial fibrillation, which is chronic. 4. History of rheumatic valve disease. 5. Status post valvulotomy at [facility] percutaneously    performed as noted in May. 6. History of hyperglycemia. 7. History of nephrolithiasis. 8. Ureteral stent placement in 2000, for kidney stone.  CURRENT  MEDICATIONS 1. Altace, unspecified dose. 2. Lasix, unspecified dose. 3. Potassium, unspecified dose. 4. Digoxin, unspecified dose. 5. Coumadin, unspecified dose.  FAMILY HISTORY:  Noncontributory.  SOCIAL HISTORY:  The patient is Hispanic.  He essentially is retired.  He has family both here and in [place], and has been traveling between the two countries, but primarily is located in [place].  He does have an American daughter-in-law who is his English as a second language teacher.  PHYSICAL EXAMINATION  VITAL SIGNS:  On admission temperature 99.4 degrees, blood pressure 100/86, pulse 86, respirations 20.  GENERAL:  A pleasant Hispanic male, who looks younger than his stated chronologic age, in no acute distress.  HEENT:  Generally is unremarkable.  NECK:  Supple.  CHEST:  Clear with no rales, wheezes, or rhonchi.  BACK:  No CVA tenderness.  No significant flank tenderness to percussion.  CARDIOVASCULAR:  With 2+ radial pulses, with 2+ bilateral femoral pulses.  He has a quiet precordium.  He has an irregularly irregular rhythm with a controlled rate.  He has a 2/6 systolic murmur heard best at the apex.  ABDOMEN:  Protuberant, soft, positive bowel sounds.  No guarding or rebound. He had tenderness in the left lower quadrant.  He had some tenderness in the left side.  He had no masses.  GENITOURINARY:  Genitalia:  Scrotum is erythematous.  Left testicle is markedly enlarged, firm, and tender.  Epididymis is markedly enlarged and tender.  EXTREMITIES:  Normal.  DERMATOLOGIC:  The patient has a large ecchymosis at the left groin, with no  palpable hematoma left or right side.  DATA BASE:  Hemoglobin 14.5, hematocrit 41, white count 11,000, with 86 segs, 6% lymphs, 8% monos, platelet count 105,000.  Urinalysis with wbcs too numerous to count, bacteria many, positive for blood.  The i-STAT with a sodium of 136, potassium 4.1, BUN 10, creatinine not known, glucose 121.  INR 1.6.  A CT  of the abdomen and pelvis was unremarkable.  ASSESSMENT 1. Genitourinary:  A patient with a very large tender testicle, suggestive    of epididymal orchitis.  Cannot rule out pyelonephritis with a markedly    positive urinalysis, but his temperature is only minimally elevated.    His white count is only minimally elevated.  PLAN:  The patient will be started on Levaquin 500 mg p.o. q.d., ibuprofen 800 mg p.o. t.i.d. for pain.  Will try Toradol for breakthrough pain, and will also give the patient Vicodin p.r.n.  Will have Dr. Bertram Millard. Rios on call for Dr. Annabell Rios, to see the patient on August 09, 2001.  2. Cardiovascular:  The patient is status post valvulotomy, current    cardiac-stable. Dictated by:   Joseph Rios, M.D. [facility] Attending Physician:  Joseph Rios DD:  08/08/01 TD:  08/09/01 Job: [id number]

## 2010-07-25 NOTE — Discharge Summary (Signed)
[place]. [facility]  Patient:    Joseph Rios, Joseph Rios Visit Number: [id number] MRN: [id number]          Service Type: MED Location: 2000 [id number] [id number] Attending Physician:  Rollene Rotunda Dictated by:   Lavella Hammock, P.A.-C. Admit Date:  05/30/2001 Discharge Date: 06/03/2001   CC:         Joseph Rios, M.D. [facility]   Referring Physician Discharge [place]  DATE OF BIRTH:  [birth year]  PROCEDURES: 1. Cardiac catheterization. 2. Coronary arteriogram. 3. Left ventriculogram. 4. Perclose.  HOSPITAL COURSE:  Joseph Rios is a 77 year old male, who has chronic atrial fibrillation and rheumatic valvular heart disease.  He had a catheterization in 2000, showing no obstructive coronary artery disease with an EF of 40%.  He travels back and forth between [place] and [place] and has not been taking any prescribed medications.  He was admitted to the hospital on May 30, 2001, complaining of cough for approximately two months but worse within the last week.  He had cardiomegaly on his chest x-ray with mild edema when he went to an Urgent Care Center and was referred to [facility] where he was admitted for further evaluation and treatment.  He diuresed well with IV Lasix and was changed to p.o. once his respiratory status stabilized.  Additionally, it was felt that he might have some asthmatic bronchitis and was started on an inhaler as well as an antibiotic. This was changed from IV to p.o. at discharge, and he is to continue taking this for five days.  Because of his history of nonobstructive coronary artery disease and left ventricular dysfunction three years ago, it was felt that a repeat catheterization was needed.  He had a cardiac catheterization on June 02, 2001, showing no stenosis in any vessel greater than 20% with an EF of 43%.  He had a tricuspid aortic root with 1+ AI.  His mitral valve had a mean gradient of 10 with a mitral valve area of .96  sq cm.  Because of his history of rheumatic mitral valve echocardiogram, he had a repeat echocardiogram done on June 01, 2001.  It showed an EF of 30-35% with moderate diffuse left ventricular hypokinesis.  It showed an area on the mitral valve of .93 sq cm with a peak gradient of 211 and a mean gradient of 5.8 mmHg as well as a mean velocity of 120 cm per second.  His RV systolic pressure was slightly elevated at 36 mmHg, and his PA systolic pressure was also slightly elevated at 36 mmHg.  Because of the appearance of the mitral valve during heart catheterization, it was felt that mitral valve annuloplasty was a good option for him, and he was being referred to [facility] for this.  Joseph Rios has been contacted by Dr. Gerri Rios, and they will follow-up with the patient.  The patient is in chronic atrial fibrillation, and his rate tends to go very high during exertion.  He does not generally take medications once he gets out of the hospital or out of the country, and so he was not started on a beta blocker to avoid rebound tachycardia.  He was started on digoxin at 0.125 mg q.d.  His heart rate without significant exertion is generally between 55-80.  Because of the patients chronic atrial fibrillation, he was started on heparin during the hospital stay and Coumadin as an outpatient.  The patient is to go home on Coumadin and follow up at the Coumadin  Clinic, but it was not felt that full overlap was needed.  His INR at discharge was 1.4.  With improvement in his respiratory status and with evaluation on his mitral valve, he was considered stable for discharge on June 03, 2001.  LABORATORY VALUES:  INR at discharge 1.4.  Hemoglobin 15.3, hematocrit 44.3, WBC 7.5, platelets 173.  Sodium 137, potassium 4.0, chloride 103, CO2 28, BUN 15, creatinine 0.9, glucose 150.  Chest x-ray:  Improved vascular congestion with improved cardiomegaly.  DISCHARGE CONDITION:  Improved.  DISCHARGE DIAGNOSES:   1. Congestive heart failure.  2. Chronic atrial fibrillation.  3. Rheumatic mitral valvular disease.  4. Mild coronary artery disease by cath this admission.  5. Left ventricular dysfunction with an ejection fraction of 43% by cath and     30-35% by echocardiogram this admission.  6. Anticoagulation with Coumadin started this admission.  7. POSSIBLE ALLERGY:  DIPYRIDAMOLE.  8. Asthmatic bronchitis with exacerbation (possible).  9. Diarrhea, possibly secondary either to Tequin or Colace. 10. Hyperglycemia, follow up with primary care physician.  DISCHARGE INSTRUCTIONS: 1. His activity level is to include no strenuous activity. 2. He is to stick to a low salt and fat diet. 3. He is to call the office for problems with the cath site. 4. He is to follow up with the Coumadin Clinic on Monday, March 31. 2003, at    4 p.m. 5. He is to see the P.A. for Dr. Gerri Rios on June 16, 2001, at 9 a.m. 6. He is to follow up with Dr. Tawanna Rios and call for an appointment. 7. He is to follow up at [facility], and they will call him.  DISCHARGE MEDICATIONS: 1. Lasix 20 mg p.o. q.d. 2. Altace 2.5 mg p.o. q.d. 3. Augmentin 875 mg b.i.d. x 5 days. 4. Albuterol MDI 2 puffs q.i.d. 5. Coumadin 5 mg q.d. or as directed. 6. Digoxin 0.125 mg q.d. 7. K-Dur 20 mEq q.d. 8. Robitussin DM 2 tsp. q.4h. p.r.n. cough. 9. Kaopectate p.r.n. diarrhea. Dictated by:   Lavella Hammock, P.A.-C. Attending Physician:  Rollene Rotunda DD:  06/03/01 TD:  06/03/01 Job: [id number]

## 2010-07-25 NOTE — Discharge Summary (Signed)
[place]. [facility]  Patient:    Joseph Rios, Joseph Rios Visit Number: [id number] MRN: [id number]          Service Type: OBV Location: 5100 [id number] Attending Physician:  Duke Salvia Dictated by:   Janora Norlander, N.P. Admit Date:  08/08/2001 Discharge Date: 08/09/2001   CC:         Daisey Must, M.D.  Excell Seltzer. Annabell Howells, M.D.   Discharge Summary  ADMITTING DIAGNOSIS:  Epididymitis versus pyelonephrosis.  DISCHARGE DIAGNOSIS: Epididymidis on the left/orchitis.  DISCHARGE MEDICATION:  Levaquin 500 mg one p.o. q.d. times ten days.  FOLLOW-UP APPOINTMENT:  The patient is to see Dr. Fuller Canada on August 17, 2001. Phone number [phone] at 1 p.m. at [address], [address].  SIGNIFICANT LABORATORIES SINCE ADMISSION:  Creatinine 1.2.  UA was nitrite positive, large amount of blood, glucose negative.  CBC:  White blood cell count 11.0, hemoglobin 14.5, hematocrit 41.0, platelet count low at 105,000. PT and INR were 18.0 and 1.6, respectively.  CT of the abdomen on August 08, 2001:  Impression was left renal cyst and cardiomegaly with small hiatal hernia all unchanged from last study, no acute abnormality identified.  CT of the pelvis:  Mild stranding in the left inguinal region consistent with recent catheterization, no discrete hematoma noted.  COURSE OF HOSPITALIZATION:  The patient recently was treated at [facility] for mitral stenosis and a valvectomy.  He developed left sided pain radiating from his groin to his chest on Saturday and was seen at [facility] ED.  Cardiac enzymes and chest x-ray were all normal; he was sent home on ibuprofen. He returned to the emergency room on August 08, 2001 complaining of left lower quadrant tenderness and markedly swollen genitalia and he was diagnosed with epididymidis and possible pyelonephrosis given his very markedly positive UA.  The plan was to admit him for IV  antibiotics and to have GU consult.  He was assessed by Dr. Earlene Plater on June 3. His findings were consistent with left epididymidis-orchitis.  The plan was to continue the Levaquin 500 mg one p.o. q.d. for ten days, to give him scrotal support with heat treatments and to arrange a follow-up appointment with Dr. Annabell Howells in one week.  Dr. Earlene Plater felt that the patient was then stable for discharge.  EXAMINATION ON DISCHARGE:  This exam was conducted in [place].  The patient states that he has some pain in his testicles; it a bit alleviated and he is able to pass urine freely.  VITAL SIGNS: 97.9, 65, 24, 92, 66 and 92% on room air.  GENERAL:  He is awake, alert and oriented and pleasant and cooperative with the exam.  HEART:  Regular rate and rhythm with murmur.  LUNGS:  Clear to auscultation.  He has good breath sounds bilaterally.  There is no wheezing, rales or rhonchi noted.  EXTREMITIES:  Without edema.  PLAN:  We will have the patient follow-up with Dr. Earlene Plater and Dr. Annabell Howells as noted above. Dictated by:   Janora Norlander, N.P. Attending Physician:  Duke Salvia DD:  08/09/01 TD:  08/10/01 Job: [id number]

## 2010-07-25 NOTE — Cardiovascular Report (Signed)
[place]. [facility]  Patient:    Joseph Rios, Joseph Rios Visit Number: [id number] MRN: [id number]          Service Type: MED Location: 2000 2037 01 Attending Physician:  Rollene Rotunda Dictated by:   Daisey Must, M.D. [facility] Proc. Date: 06/02/01 Admit Date:  05/30/2001 Discharge Date: 06/03/2001   CC:         Evette Georges, M.D. [facility]  Cardiac Catheterization Laboratory   Cardiac Catheterization  REDICTATION  PROCEDURES PERFORMED: Right and left heart catheterization with coronary angiography, left ventriculography, and aortic root angiography.  INDICATIONS: The patient is a 77 year old male with a history of mitral stenosis and atrial fibrillation. He presented at the hospital with congestive heart failure. After stabilization, we opted to proceed with cardiac catheterization for hemodynamic assessment.  DESCRIPTION OF PROCEDURE: An 8 French sheath was placed in the right femoral artery, 6 French sheath in the right femoral artery. Right heart catheterization was performed with a Swan-Ganz catheter. Left heart catheterization was performed with Standard Judkins 6 French catheters. Contrast was Omnipaque. There were no complications. At the conclusion of the procedure, a Perclose vascular closure device was placed in the right femoral artery with good hemostasis.  RESULTS:  HEMODYNAMICS: Right atrial mean pressure 8, right ventricular pressure 38/10, pulmonary artery pressure 38/20, pulmonary capillary wedge mean, pressure 17, left ventricular pressure 122/12. Aortic pressure 124/80. There is no gradient across the aortic valve.  Cardiac output by a thermodilution method is 3.1, cardiac index 1.8. Cardiac output by the Fick method is 2.0, cardiac index 1.1. The mitral valve calculated mean gradient was 10 mmHg with a mitral valve area calculated at 0.96 sq cm. This was utilizing a thermodilution output of 3.1.  LEFT VENTRICULOGRAM: There is  moderate global hypokinesis. Ejection fraction is calculated at 43%. There is no mitral regurgitation.  Aortic root angiography reveals a tricuspid aortic valve. There is 1+ aortic insufficiency.  CORONARY ARTERIOGRAPHY: (Right dominant).  Left main is normal.  Left anterior descending has a diffuse minor luminal irregularities in the mid vessel. The LAD gives rise to two small diagonal branches.  Left circumflex has minor luminal irregularities in the mid vessel. It gives rise to a small OM-1, large branching OM-2 and a normal sized OM-3.  Right coronary artery is a dominant vessel. It gives rise to a normal sized posterior descending artery, a small first posterolateral branch and a large branching second posterolateral. The right coronary artery is normal.  IMPRESSIONS: 1. Mild pulmonary hypertension. 2. Moderately decreased left ventricular systolic function. 3. Severe mitral stenosis. 4. No significant coronary artery disease identified.  PLAN: The patient will be referred for evaluation for possible balloon mitral valvuloplasty. Dictated by:   Daisey Must, M.D. [facility] Attending Physician:  Rollene Rotunda DD:  06/13/01 TD:  06/13/01 Job: [id number]

## 2010-07-25 NOTE — H&P (Signed)
[place]  Patient:    Joseph Rios, Joseph Rios Visit Number: [id number] MRN: [id number]          Service Type: MED Location: 2000 2037 01 Attending Physician:  Rollene Rotunda Dictated by:   Rollene Rotunda, M.D. [facility] Admit Date:  05/30/2001                           History and Physical  PRIMARY CARE PHYSICIAN:  Dr. Evette Georges.  CARDIOLOGIST:  Dr. Loraine Leriche Pulsipher.  REASON FOR PRESENTATION:  Evaluate patient with cough and cardiomegaly.  HISTORY OF PRESENT ILLNESS:  Patient is a 77 year old [place] gentleman who speaks no [place].  He has been seen by our group in the past for management of atrial fibrillation and known rheumatic valvular disease.  The last hospital visit was in March of 2000, at which time he had a cardiac catheterization demonstrating no obstructive coronary disease.  His EF was mildly reduced and estimated to be at approximately 40%.  He was felt to have mild mitral stenosis with a rheumatic-appearing valve and moderate mitral regurgitation.  He was in chronic atrial fibrillation.  He was managed medically with these problems but has been lost to followup.  He travels back and forth between [place] and [place].  He has not been taking any medications.  He has been complaining of cough for approximately two months, however, it became worse in the last week.  This was nonproductive.  He has had some lower rib pain associated with this cough.  He does report some fevers and night sweats.  He has had some increased dyspnea with mild exertion but does not describe PND or orthopnea.  He has had increased abdominal girth but is not describing increased weight or lower extremity swelling.  He went to urgent care where he was found to have cardiomegaly on his chest x-ray with mild edema and was referred to [facility].  PAST MEDICAL HISTORY:  Permanent atrial fibrillation, previous CVA, benign prostatic hypertrophy, rheumatic  heart disease, mildly reduced ejection fraction as described above.  PAST SURGICAL HISTORY:  None.  ALLERGIES:  ? DIPYRIDAMOLE.  MEDICATIONS:  (Upon last discharge.) 1. Altace 2.5 mg q.d. 2. Coumadin. 3. Lanoxin 0.125 mg q.d.  SOCIAL HISTORY:  The patient is married and his wife is in [place].  He lives here with his son.  He is currently not working.  He quit smoking 25 years ago.  FAMILY HISTORY:  Noncontributory for early coronary artery disease.  REVIEW OF SYSTEMS:  As stated in the HPI and otherwise positive for all other systems.  PHYSICAL EXAMINATION:  GENERAL:  The patient is in no distress.  VITAL SIGNS:  Blood pressure 140/95, heart rate 86 and irregularly irregular.  HEENT:  Eyelids unremarkable, pupils are equal, round and reactive to light, fundi not visualized.  Oral mucosa unremarkable.  NECK:  No jugular venous distention.  Wave form within normal limits.  Carotid upstroke brisk and symmetric, no bruits.  No thyromegaly.  LYMPHATICS:  No cervical, axillary or inguinal adenopathy.  LUNGS:  Expiratory wheezes.  No crackles, no obvious egophony.  BACK:  No CVA tenderness.  CHEST:  Minimally palpable RV lift, PMI not displaced or sustained, soft S1, loud S2, questionable opening snap; no obvious diastolic murmurs, no rubs.  ABDOMEN:  Mildly distended.  Mild right upper quadrant tenderness.  No obvious hepatomegaly, no splenomegaly.  SKIN:  No rashes, no nodules.  EXTREMITIES:  Pulses 2+ throughout, no edema.  NEUROLOGIC:  Oriented to person, place and time.  Cranial nerves II-XII grossly intact.  Motor grossly intact throughout.  LABORATORY AND ACCESSORY DATA:  Chest x-ray:  Cardiomegaly with a double density sign indicative of left atrial enlargement, mild bilateral vascular congestion.  EKG:  Atrial fibrillation, axis within normal limits, intervals within normal limits; no acute ST-T wave changes.  Labs:  WBC 5.5, hemoglobin 14.8, hematocrit  42.4, platelets 143,000; INR 1.4; sodium 139, potassium 4.4, chloride 106, creatinine 0.8, BUN 9; CK 224, MB 3.3, troponin 0.09.  ASSESSMENT AND PLAN: 1. Cough:  I suspect that the cough is predominantly related to pulmonary    edema secondary to mitral stenosis.  There may also be a component of    bronchitis.  At this point, I will treat him with intravenous diuretics.    He will also be empirically started on antibiotics.  He will need an    echocardiogram to further evaluate his mitral stenosis.  (A brief-look    echocardiogram suggested an ejection fraction of 30% with a severely    rheumatic mitral valve and severe left atrial enlargement.)  I suspect he    may need some definitive therapy for his mitral valve with either valve    replacement or valvuloplasty if possible. 2. Atrial fibrillation:  The patient is at very high risk for stroke with    mitral stenosis and atrial fibrillation.  He will be started on    anticoagulation to include heparin.  He will need Coumadin long-term,    although compliance is clearly an issue. 3. Mild troponin elevation:  The patient had nonobstructive coronary disease,    below.  I would not suggest further cardiovascular testing specifically to    work this up unless surgery is planned. 4. Followup:  The patient has complicated his medical care by not receiving or    complying with consistent followup.  We need to reinforce this and educate. ictated by:   Rollene Rotunda, M.D. [facility] Attending Physician:  Rollene Rotunda DD:  05/31/01 TD:  05/31/01 Job: [id number]

## 2010-07-28 ENCOUNTER — Telehealth: Payer: Self-pay | Admitting: *Deleted

## 2010-07-28 DIAGNOSIS — I10 Essential (primary) hypertension: Secondary | ICD-10-CM

## 2010-07-28 MED ORDER — LOSARTAN POTASSIUM 25 MG PO TABS: 25.0000 mg | ORAL_TABLET | Freq: Every day | ORAL | Status: DC

## 2010-07-28 NOTE — Telephone Encounter (Signed)
Received a request from [facility] [address] for a substitution for Benicar 20 as it is not covered by pts insurance.  In accordance to the information we have related to his insurance and after researching the pharmaceutical formulary for his plan - it is noted to Cozaar is listed as a preferred medication.  A rx will be sent into [facility]

## 2010-08-05 ENCOUNTER — Other Ambulatory Visit: Payer: Self-pay | Admitting: *Deleted

## 2010-09-11 ENCOUNTER — Other Ambulatory Visit: Payer: Self-pay | Admitting: Cardiology

## 2010-10-12 ENCOUNTER — Other Ambulatory Visit: Payer: Self-pay | Admitting: Cardiology

## 2010-10-16 ENCOUNTER — Other Ambulatory Visit: Payer: Self-pay | Admitting: *Deleted

## 2010-10-16 MED ORDER — OMEPRAZOLE 20 MG PO CPDR: 20.0000 mg | DELAYED_RELEASE_CAPSULE | Freq: Every day | ORAL | Status: AC

## 2010-12-18 LAB — COMPREHENSIVE METABOLIC PANEL
ALT: 35
Albumin: 3.4 — ABNORMAL LOW
Alkaline Phosphatase: 112
Calcium: 8.8
Glucose, Bld: 132 — ABNORMAL HIGH
Potassium: 4.2
Sodium: 138
Total Protein: 6.6

## 2010-12-18 LAB — B-NATRIURETIC PEPTIDE (CONVERTED LAB): Pro B Natriuretic peptide (BNP): 187 — ABNORMAL HIGH

## 2010-12-24 LAB — CBC
HCT: 43.2
Hemoglobin: 14.9
MCV: 92.6
Platelets: 178
Platelets: 182
RBC: 4.86
WBC: 8.4
WBC: 9.6

## 2010-12-24 LAB — DIFFERENTIAL
Basophils Absolute: 0
Basophils Relative: 0
Eosinophils Absolute: 0
Eosinophils Relative: 0
Monocytes Absolute: 1.3 — ABNORMAL HIGH

## 2010-12-24 LAB — BASIC METABOLIC PANEL
CO2: 28
Calcium: 8.8
Calcium: 9
Calcium: 9.1
Chloride: 101
Creatinine, Ser: 0.85
Creatinine, Ser: 1.08
GFR calc Af Amer: >60
GFR calc Af Amer: >60
GFR calc non Af Amer: >60
GFR calc non Af Amer: >60
GFR calc non Af Amer: >60
GFR calc non Af Amer: >60
Glucose, Bld: 124 — ABNORMAL HIGH
Potassium: 3.6
Potassium: 4.8
Sodium: 135
Sodium: 137
Sodium: 138

## 2010-12-24 LAB — URINALYSIS, ROUTINE W REFLEX MICROSCOPIC
Glucose, UA: 100 — AB
Ketones, ur: NEGATIVE
Leukocytes, UA: NEGATIVE
Protein, ur: NEGATIVE
pH: 7

## 2010-12-24 LAB — DIGOXIN LEVEL: Digoxin Level: <0.2 — ABNORMAL LOW

## 2010-12-24 LAB — PROTIME-INR
INR: 1.3
INR: 1.4
INR: 2.2 — ABNORMAL HIGH
INR: 2.4 — ABNORMAL HIGH
INR: 2.5 — ABNORMAL HIGH
Prothrombin Time: 17.4 — ABNORMAL HIGH
Prothrombin Time: 25.3 — ABNORMAL HIGH
Prothrombin Time: 28 — ABNORMAL HIGH

## 2010-12-24 LAB — CULTURE, BLOOD (ROUTINE X 2): Culture: NO GROWTH

## 2010-12-24 LAB — COMPREHENSIVE METABOLIC PANEL
ALT: 18
AST: 21
Albumin: 3.5
Alkaline Phosphatase: 87
CO2: 25
Chloride: 102
GFR calc Af Amer: >60
GFR calc non Af Amer: >60
Potassium: 3.2 — ABNORMAL LOW
Total Bilirubin: 1.5 — ABNORMAL HIGH

## 2010-12-24 LAB — POCT CARDIAC MARKERS
CKMB, poc: <1 — ABNORMAL LOW
Myoglobin, poc: 53.8
Operator id: 161631
Troponin i, poc: 0.08 — ABNORMAL HIGH

## 2010-12-24 LAB — URINE MICROSCOPIC-ADD ON

## 2010-12-24 LAB — URIC ACID: Uric Acid, Serum: 5.8

## 2010-12-24 LAB — B-NATRIURETIC PEPTIDE (CONVERTED LAB): Pro B Natriuretic peptide (BNP): 651 — ABNORMAL HIGH

## 2011-02-13 ENCOUNTER — Other Ambulatory Visit: Payer: Self-pay | Admitting: Cardiology

## 2011-03-24 ENCOUNTER — Telehealth: Payer: Self-pay | Admitting: *Deleted

## 2011-03-24 NOTE — Telephone Encounter (Signed)
F/u with this patient overdue to coumadin management, will attempt contact via phone and letter.

## 2011-08-19 ENCOUNTER — Other Ambulatory Visit: Payer: Self-pay | Admitting: Cardiology

## 2011-08-20 NOTE — Telephone Encounter (Signed)
..   Requested Prescriptions   Pending Prescriptions Disp Refills  . losartan (COZAAR) 25 MG tablet [Pharmacy Med Name: LOSARTAN 25MG  TABLETS] 30 tablet 6    Sig: TAKE 1 TABLET BY MOUTH DAILY  . furosemide (LASIX) 80 MG tablet [Pharmacy Med Name: FUROSEMIDE 80MG  TABLETS] 60 tablet 6    Sig: TAKE 1 TABLET BY MOUTH TWICE DAILY  . potassium chloride SA (K-DUR,KLOR-CON) 20 MEQ tablet [Pharmacy Med Name: POTASSIUM CL ER TABLETS] 30 tablet 6    Sig: TAKE 1 TABLET BY MOUTH DAILY

## 2011-08-20 NOTE — Telephone Encounter (Signed)
..   Requested Prescriptions   Signed Prescriptions Disp Refills  . losartan (COZAAR) 25 MG tablet 30 tablet 6    Sig: TAKE 1 TABLET BY MOUTH DAILY    Authorizing Provider: Rollene Rotunda    Ordering User: Keyler Hoge M  . furosemide (LASIX) 80 MG tablet 60 tablet 6    Sig: TAKE 1 TABLET BY MOUTH TWICE DAILY    Authorizing Provider: Rollene Rotunda    Ordering User: Alexandr Yaworski M  . potassium chloride SA (K-DUR,KLOR-CON) 20 MEQ tablet 30 tablet 6    Sig: TAKE 1 TABLET BY MOUTH DAILY    Authorizing Provider: Rollene Rotunda    Ordering User: Christella Hartigan, Kerrianne Jeng Judie Petit

## 2011-11-20 ENCOUNTER — Other Ambulatory Visit: Payer: Self-pay | Admitting: Cardiology

## 2011-11-23 NOTE — Telephone Encounter (Signed)
..   Requested Prescriptions   Pending Prescriptions Disp Refills  . digoxin (LANOXIN) 0.125 MG tablet [Pharmacy Med Name: DIGOXIN 0.125MG  TABLETS (YELLOW)] 30 tablet 3    Sig: TAKE 1 TABLET BY MOUTH DAILY  . omeprazole (PRILOSEC) 20 MG capsule [Pharmacy Med Name: OMEPRAZOLE 20MG  CAPSULES]  5    Sig: TAKE 1 CAPSULE BY MOUTH DAILY  . carvedilol (COREG) 6.25 MG tablet [Pharmacy Med Name: CARVEDILOL 6.25MG  TABLETS] 60 tablet 3    Sig: TAKE 1 TABLET BY MOUTH TWICE DAILY WITH A MEAL  .Marland KitchenPatient needs to contact office to schedule  Appointment  for future refills.Ph:[phone]. Thank you.

## 2012-12-06 ENCOUNTER — Other Ambulatory Visit: Payer: Self-pay | Admitting: Cardiology

## 2013-05-19 ENCOUNTER — Telehealth: Payer: Self-pay | Admitting: *Deleted

## 2013-05-19 NOTE — Telephone Encounter (Signed)
LMOM for pt to call info us of his coumadin status and if he is still in [place] or in [place] as he did in past travel back.
